# Patient Record
Sex: Male | Born: 1955 | Race: Black or African American | Hispanic: No | Marital: Single
Health system: Southern US, Academic
[De-identification: ages and names within clinical notes are randomized; demographics above are authoritative.]

## PROBLEM LIST (undated history)

## (undated) ENCOUNTER — Ambulatory Visit: Payer: MEDICARE

## (undated) ENCOUNTER — Ambulatory Visit: Payer: MEDICARE | Attending: Otolaryngology | Primary: Otolaryngology

## (undated) ENCOUNTER — Encounter

## (undated) ENCOUNTER — Telehealth: Attending: Otolaryngology | Primary: Otolaryngology

## (undated) ENCOUNTER — Ambulatory Visit

## (undated) ENCOUNTER — Encounter: Attending: Hematology & Oncology | Primary: Hematology & Oncology

## (undated) ENCOUNTER — Telehealth
Attending: Student in an Organized Health Care Education/Training Program | Primary: Student in an Organized Health Care Education/Training Program

## (undated) ENCOUNTER — Encounter: Attending: Adult Health | Primary: Adult Health

## (undated) ENCOUNTER — Encounter: Attending: Geriatric Medicine | Primary: Geriatric Medicine

## (undated) ENCOUNTER — Encounter
Payer: MEDICARE | Attending: Student in an Organized Health Care Education/Training Program | Primary: Student in an Organized Health Care Education/Training Program

## (undated) ENCOUNTER — Telehealth: Attending: Nephrology | Primary: Nephrology

## (undated) ENCOUNTER — Encounter: Attending: Family | Primary: Family

## (undated) ENCOUNTER — Encounter: Payer: MEDICARE | Attending: Speech-Language Pathologist | Primary: Speech-Language Pathologist

## (undated) ENCOUNTER — Encounter
Attending: Student in an Organized Health Care Education/Training Program | Primary: Student in an Organized Health Care Education/Training Program

## (undated) ENCOUNTER — Telehealth

## (undated) ENCOUNTER — Encounter: Attending: Speech-Language Pathologist | Primary: Speech-Language Pathologist

## (undated) ENCOUNTER — Telehealth: Attending: Hematology & Oncology | Primary: Hematology & Oncology

## (undated) ENCOUNTER — Encounter: Payer: MEDICARE | Attending: Otolaryngology | Primary: Otolaryngology

## (undated) ENCOUNTER — Encounter: Attending: Radiation Oncology | Primary: Radiation Oncology

## (undated) ENCOUNTER — Non-Acute Institutional Stay: Payer: MEDICARE

## (undated) ENCOUNTER — Encounter: Attending: Oncology | Primary: Oncology

## (undated) ENCOUNTER — Non-Acute Institutional Stay: Payer: MEDICARE | Attending: Clinical | Primary: Clinical

## (undated) ENCOUNTER — Telehealth: Attending: Clinical | Primary: Clinical

## (undated) ENCOUNTER — Ambulatory Visit: Payer: MEDICARE | Attending: Nephrology | Primary: Nephrology

## (undated) ENCOUNTER — Institutional Professional Consult (permissible substitution)
Payer: MEDICARE | Attending: Student in an Organized Health Care Education/Training Program | Primary: Student in an Organized Health Care Education/Training Program

## (undated) ENCOUNTER — Encounter: Attending: Nephrology | Primary: Nephrology

## (undated) ENCOUNTER — Encounter: Payer: MEDICARE | Attending: Clinical | Primary: Clinical

## (undated) ENCOUNTER — Encounter: Attending: Internal Medicine | Primary: Internal Medicine

## (undated) ENCOUNTER — Ambulatory Visit: Payer: MEDICARE | Attending: Hematology & Oncology | Primary: Hematology & Oncology

## (undated) ENCOUNTER — Encounter: Payer: MEDICARE | Attending: Nephrology | Primary: Nephrology

## (undated) ENCOUNTER — Non-Acute Institutional Stay: Payer: MEDICARE | Attending: Otolaryngology | Primary: Otolaryngology

## (undated) ENCOUNTER — Inpatient Hospital Stay

## (undated) ENCOUNTER — Non-Acute Institutional Stay: Payer: MEDICARE | Attending: Radiation Oncology | Primary: Radiation Oncology

## (undated) ENCOUNTER — Encounter: Payer: MEDICARE | Attending: Internal Medicine | Primary: Internal Medicine

## (undated) ENCOUNTER — Ambulatory Visit: Payer: MEDICARE | Attending: Foot & Ankle Surgery | Primary: Foot & Ankle Surgery

## (undated) ENCOUNTER — Encounter: Attending: Otolaryngology | Primary: Otolaryngology

## (undated) ENCOUNTER — Ambulatory Visit: Payer: MEDICARE | Attending: Clinical | Primary: Clinical

## (undated) ENCOUNTER — Ambulatory Visit
Payer: MEDICARE | Attending: Student in an Organized Health Care Education/Training Program | Primary: Student in an Organized Health Care Education/Training Program

## (undated) ENCOUNTER — Encounter: Payer: MEDICARE | Attending: Radiation Oncology | Primary: Radiation Oncology

## (undated) ENCOUNTER — Telehealth: Attending: Adult Health | Primary: Adult Health

## (undated) ENCOUNTER — Encounter
Payer: MEDICARE | Attending: Pharmacist Clinician (PhC)/ Clinical Pharmacy Specialist | Primary: Pharmacist Clinician (PhC)/ Clinical Pharmacy Specialist

## (undated) ENCOUNTER — Encounter: Payer: MEDICARE | Attending: Pharmacist | Primary: Pharmacist

## (undated) ENCOUNTER — Ambulatory Visit: Payer: MEDICARE | Attending: Registered" | Primary: Registered"

## (undated) ENCOUNTER — Encounter: Payer: MEDICARE | Attending: Trauma Surgery | Primary: Trauma Surgery

## (undated) ENCOUNTER — Non-Acute Institutional Stay: Payer: MEDICARE | Attending: Internal Medicine | Primary: Internal Medicine

## (undated) DIAGNOSIS — C801 Malignant (primary) neoplasm, unspecified: Secondary | ICD-10-CM

## (undated) HISTORY — PX: REPLACEMENT TOTAL KNEE: SUR1224

---
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
## 2005-05-12 ENCOUNTER — Emergency Department: Payer: Self-pay | Admitting: Emergency Medicine

## 2008-09-04 ENCOUNTER — Inpatient Hospital Stay: Payer: Self-pay | Admitting: Internal Medicine

## 2009-08-27 IMAGING — US ABDOMEN ULTRASOUND
1 series · 17 of 25 positions shown · non-contrast
Comparison: No comparison

REASON FOR EXAM: nausea, vomiting, abdominal pain - eval for biliary
disease
COMMENTS:

PROCEDURE:     US  - US ABDOMEN GENERAL SURVEY  - September 03, 2008 [DATE]
RESULT:     Indication: Nausea, vomiting
TECHNIQUE: Multiple gray-scale and color-flow Doppler images of the abdomen
are presented for review.

[Series 1: abdomen ultrasound · 17 of 58 slices shown]
[im 1/58]
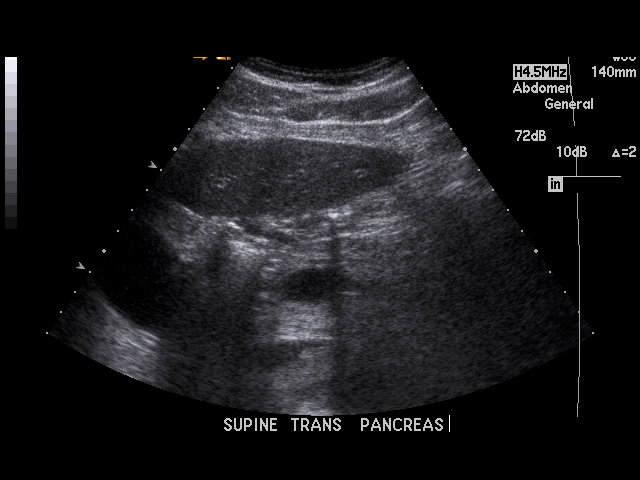
[im 5/58]
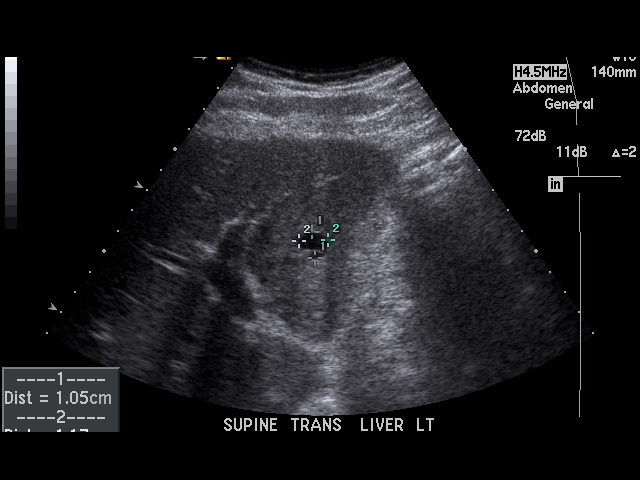
[im 8/58]
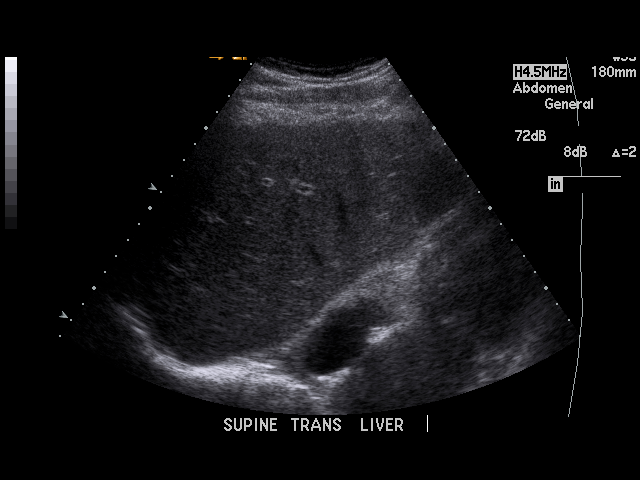
[im 12/58]
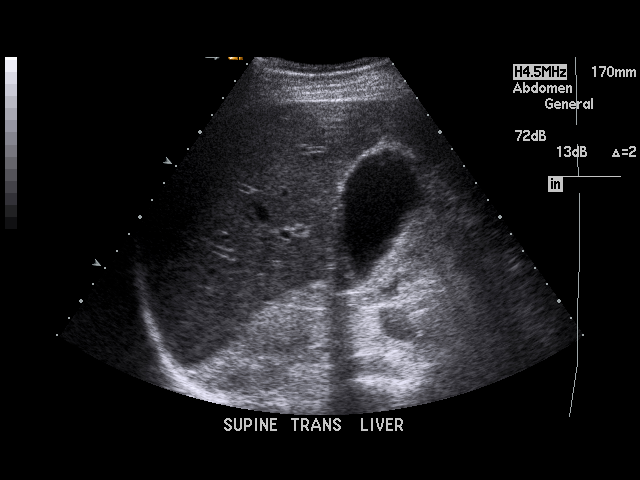
[im 15/58]
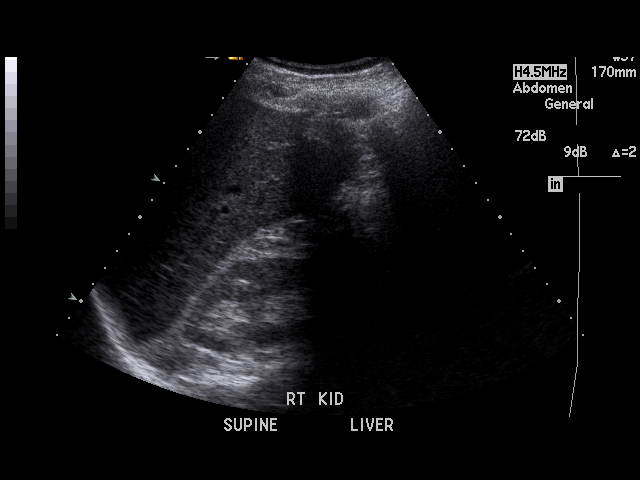
[im 20/58]
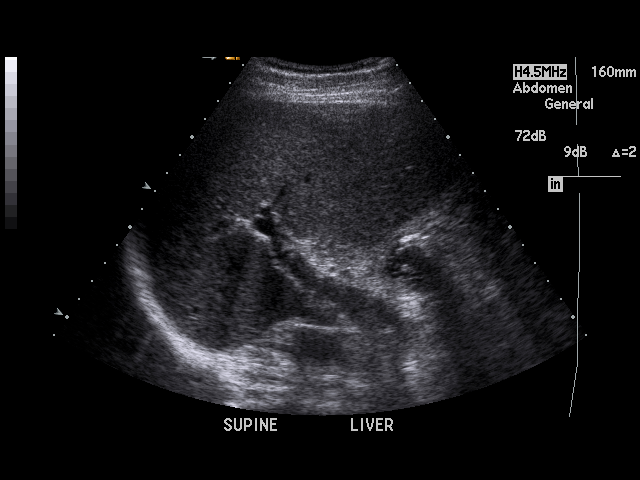
[im 22/58]
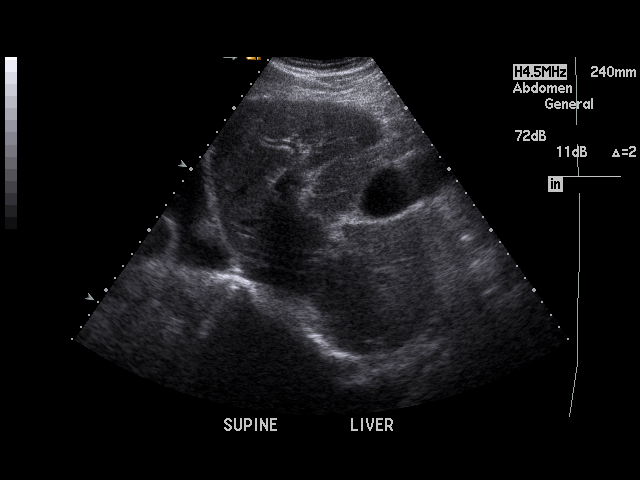
[im 27/58]
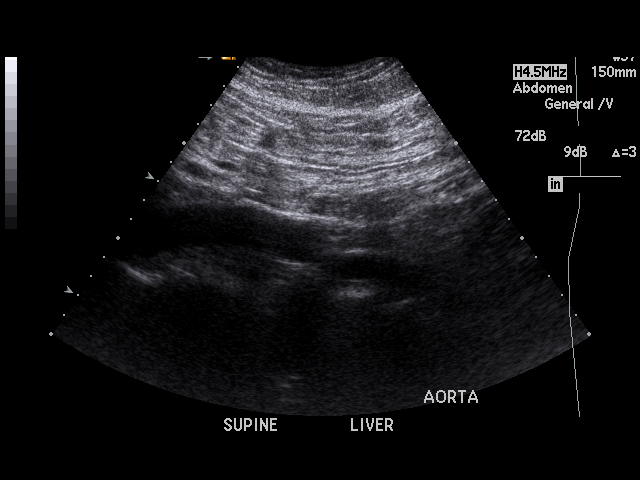
[im 29/58]
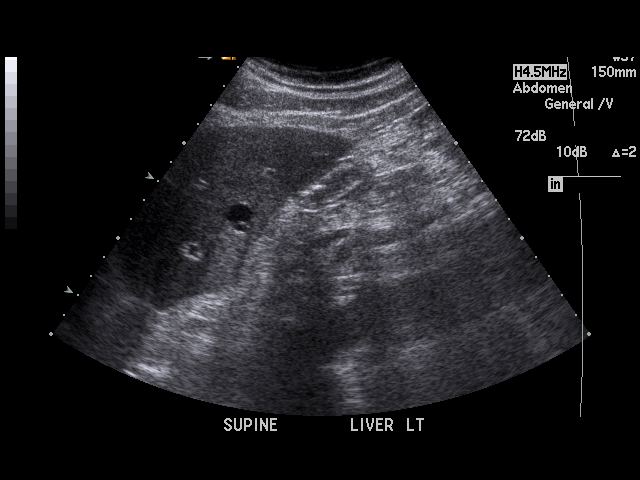
[im 31/58]
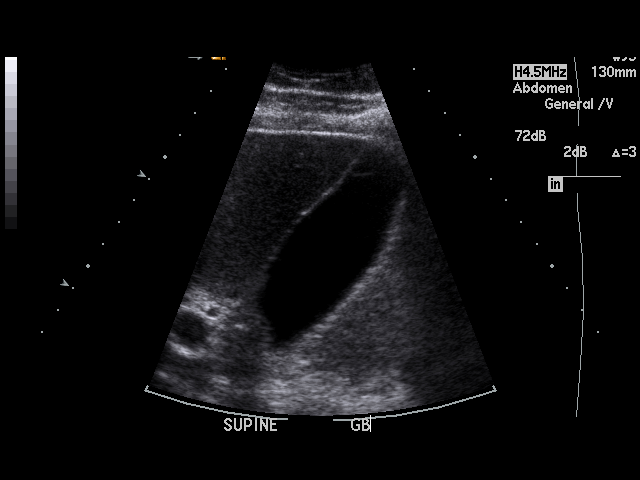
[im 36/58]
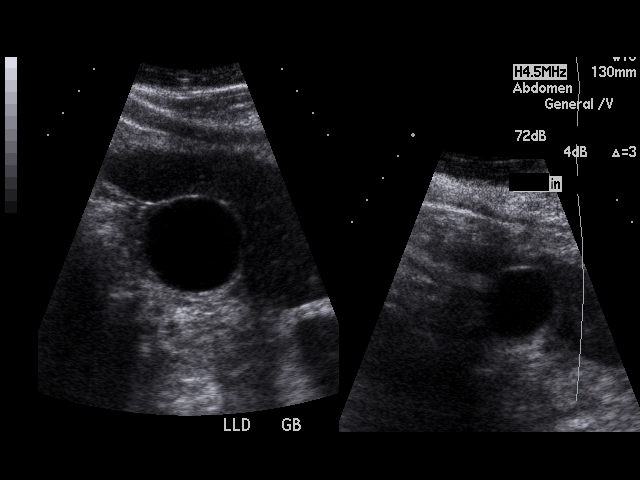
[im 39/58]
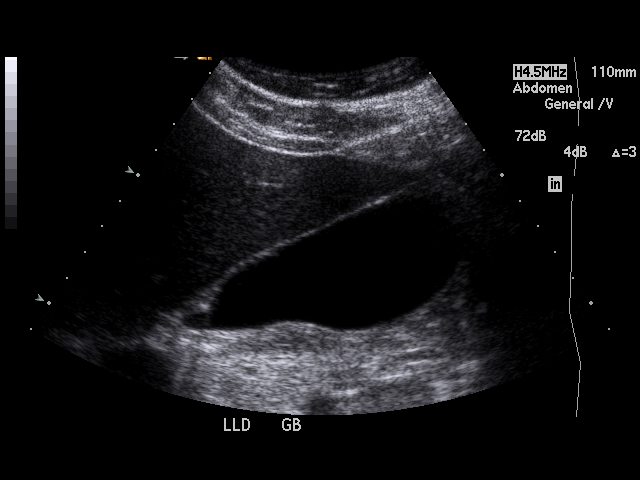
[im 43/58]
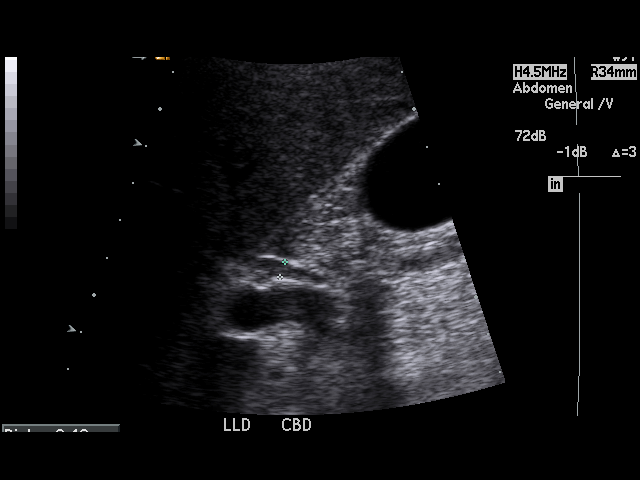
[im 46/58]
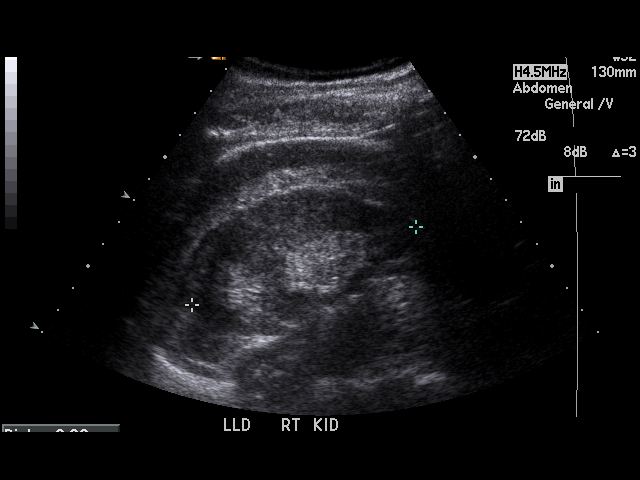
[im 50/58]
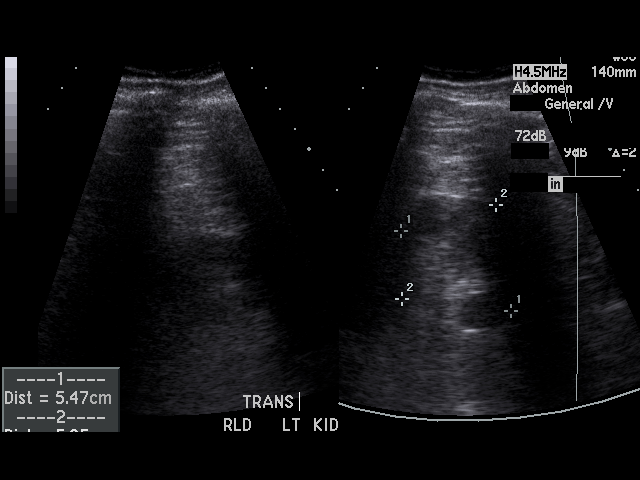
[im 53/58]
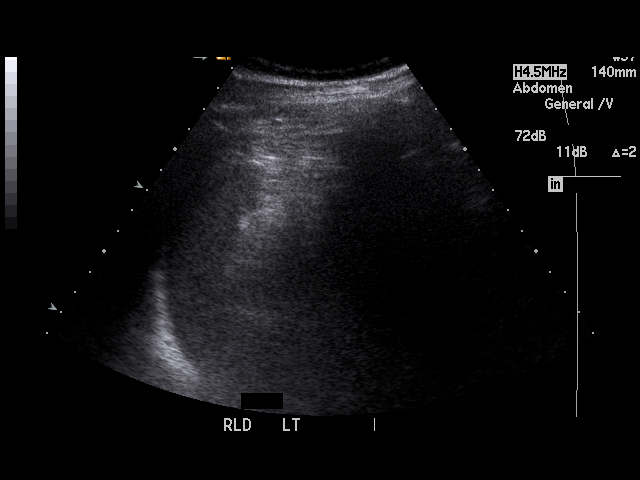
[im 58/58]
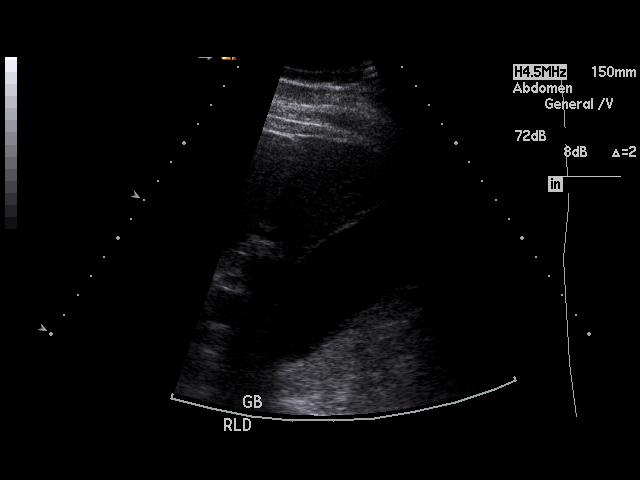

[17 of 25 positions shown; findings below may reference images not displayed]

FINDINGS: Visualized portions of the liver demonstrate normal echogenicity and normal
contours. There is a 12 mm anechoic mass in the left hepatic lobe with
increased through-transmission and an imperceptible posterior wall most
consistent with a cyst. There is no intra or extrahepatic biliary ductal
dilatation. The common duct measures 0.4 cm in maximal diameter. No
cholelithiasis or biliary sludge. No gallbladder wall thickening,
pericholecystic fluid, or sonographic Murphy's sign.

The pancreas is not visualized secondary to overlying bowel gas. The spleen
is unremarkable. Bilateral kidneys are normal in echogenicity and size. The
right kidney measures 9.3 cm. The left kidney measures 10 cm. There are no
renal calculi or hydronephrosis. The abdominal aorta and IVC are
unremarkable.
IMPRESSION: 1. No cholelithiasis or sonographic evidence of acute cholecystitis.
2. Anechoic left hepatic mass likely reflecting a cyst.
3. Nonvisualized pancreas.

## 2011-09-15 ENCOUNTER — Emergency Department: Payer: Self-pay | Admitting: Emergency Medicine

## 2011-09-15 LAB — URINALYSIS, COMPLETE
Bacteria: NONE SEEN
Ketone: NEGATIVE
Leukocyte Esterase: NEGATIVE
Nitrite: NEGATIVE
Ph: 5 (ref 4.5–8.0)
Protein: 100
RBC,UR: <1 /HPF (ref 0–5)
Squamous Epithelial: <1

## 2017-03-05 ENCOUNTER — Ambulatory Visit: Admission: RE | Admit: 2017-03-05 | Discharge: 2017-03-05 | Payer: MEDICARE | Admitting: Psychiatry

## 2017-03-05 DIAGNOSIS — F321 Major depressive disorder, single episode, moderate: Principal | ICD-10-CM

## 2017-03-05 MED ORDER — FLUOXETINE 20 MG CAPSULE
ORAL_CAPSULE | Freq: Every day | ORAL | 0 refills | 0 days | Status: CP
Start: 2017-03-05 — End: 2017-04-04

## 2017-03-05 MED ORDER — MIRTAZAPINE 15 MG TABLET
ORAL_TABLET | Freq: Every evening | ORAL | 0 refills | 0 days | Status: CP
Start: 2017-03-05 — End: 2017-04-04

## 2017-03-09 ENCOUNTER — Ambulatory Visit
Admission: RE | Admit: 2017-03-09 | Discharge: 2017-03-09 | Disposition: A | Discharge status: Home / Self Care | Payer: MEDICARE

## 2017-03-09 ENCOUNTER — Ambulatory Visit
Admission: RE | Admit: 2017-03-09 | Discharge: 2017-03-09 | Disposition: A | Discharge status: Home / Self Care | Payer: MEDICARE | Attending: Hematology & Oncology | Admitting: Hematology & Oncology

## 2017-03-09 ENCOUNTER — Ambulatory Visit
Admission: RE | Admit: 2017-03-09 | Discharge: 2017-03-09 | Disposition: A | Discharge status: Home / Self Care | Attending: Family

## 2017-03-09 DIAGNOSIS — G893 Neoplasm related pain (acute) (chronic): Secondary | ICD-10-CM

## 2017-03-09 DIAGNOSIS — C109 Malignant neoplasm of oropharynx, unspecified: Principal | ICD-10-CM

## 2017-03-09 DIAGNOSIS — J189 Pneumonia, unspecified organism: Secondary | ICD-10-CM

## 2017-03-09 DIAGNOSIS — F1120 Opioid dependence, uncomplicated: Secondary | ICD-10-CM

## 2017-03-09 DIAGNOSIS — Z515 Encounter for palliative care: Secondary | ICD-10-CM

## 2017-03-09 DIAGNOSIS — C760 Malignant neoplasm of head, face and neck: Principal | ICD-10-CM

## 2017-03-09 DIAGNOSIS — Z8589 Personal history of malignant neoplasm of other organs and systems: Principal | ICD-10-CM

## 2017-03-09 MED ORDER — OXYCODONE ER 20 MG TABLET,CRUSH RESISTANT,EXTENDED RELEASE 12 HR
ORAL_TABLET | Freq: Three times a day (TID) | ORAL | 0 refills | 0.00000 days | Status: CP
Start: 2017-03-09 — End: 2017-03-23

## 2017-03-09 MED ORDER — OXYCODONE 15 MG TABLET
ORAL_TABLET | Freq: Four times a day (QID) | ORAL | 0 refills | 0.00000 days | Status: CP | PRN
Start: 2017-03-09 — End: 2017-03-31

## 2017-03-09 MED ORDER — OMEPRAZOLE 20 MG CAPSULE,DELAYED RELEASE
ORAL_CAPSULE | 1 refills | 0 days | Status: CP
Start: 2017-03-09 — End: 2018-03-08

## 2017-03-09 MED ORDER — OXYCODONE ER 20 MG TABLET,CRUSH RESISTANT,EXTENDED RELEASE 12 HR: 20 mg | tablet | Freq: Three times a day (TID) | 0 refills | 0 days | Status: AC

## 2017-03-09 MED FILL — OXYCONTIN/20MG/TAB: OXYCONTIN/20MG/TAB | 14 days supply | Qty: 42 | Fill #0

## 2017-03-09 MED FILL — OXYCODONE HCL/15MG/TABS: OXYCODONE HCL/15MG/TABS | 14 days supply | Qty: 112 | Fill #0

## 2017-03-23 MED ORDER — OXYCODONE 15 MG TABLET
ORAL_TABLET | Freq: Four times a day (QID) | ORAL | 0 refills | 0.00000 days | Status: CP | PRN
Start: 2017-03-23 — End: 2017-03-31

## 2017-03-23 MED FILL — OXYCONTIN/20MG/TAB: OXYCONTIN/20MG/TAB | 14 days supply | Qty: 42 | Fill #0

## 2017-03-23 MED FILL — OXYCODONE HCL/15MG/TABS: OXYCODONE HCL/15MG/TABS | 14 days supply | Qty: 112 | Fill #0

## 2017-03-31 ENCOUNTER — Ambulatory Visit
Admission: RE | Admit: 2017-03-31 | Discharge: 2017-03-31 | Disposition: A | Discharge status: Home / Self Care | Attending: Family | Admitting: Family

## 2017-03-31 ENCOUNTER — Ambulatory Visit
Admission: RE | Admit: 2017-03-31 | Discharge: 2017-03-31 | Disposition: A | Discharge status: Home / Self Care | Payer: MEDICARE | Attending: Student in an Organized Health Care Education/Training Program | Admitting: Student in an Organized Health Care Education/Training Program

## 2017-03-31 ENCOUNTER — Ambulatory Visit
Admission: RE | Admit: 2017-03-31 | Discharge: 2017-03-31 | Disposition: A | Discharge status: Home / Self Care | Payer: MEDICARE | Attending: Otolaryngology | Admitting: Otolaryngology

## 2017-03-31 DIAGNOSIS — Z515 Encounter for palliative care: Secondary | ICD-10-CM

## 2017-03-31 DIAGNOSIS — M1712 Unilateral primary osteoarthritis, left knee: Secondary | ICD-10-CM

## 2017-03-31 DIAGNOSIS — F1123 Opioid dependence with withdrawal: Secondary | ICD-10-CM

## 2017-03-31 DIAGNOSIS — M1711 Unilateral primary osteoarthritis, right knee: Secondary | ICD-10-CM

## 2017-03-31 DIAGNOSIS — R1310 Dysphagia, unspecified: Secondary | ICD-10-CM

## 2017-03-31 DIAGNOSIS — F1120 Opioid dependence, uncomplicated: Secondary | ICD-10-CM

## 2017-03-31 DIAGNOSIS — F321 Major depressive disorder, single episode, moderate: Secondary | ICD-10-CM

## 2017-03-31 DIAGNOSIS — C109 Malignant neoplasm of oropharynx, unspecified: Principal | ICD-10-CM

## 2017-03-31 DIAGNOSIS — G893 Neoplasm related pain (acute) (chronic): Secondary | ICD-10-CM

## 2017-03-31 DIAGNOSIS — Z720 Tobacco use: Secondary | ICD-10-CM

## 2017-03-31 DIAGNOSIS — Z923 Personal history of irradiation: Secondary | ICD-10-CM

## 2017-03-31 DIAGNOSIS — Z9221 Personal history of antineoplastic chemotherapy: Secondary | ICD-10-CM

## 2017-03-31 DIAGNOSIS — F17200 Nicotine dependence, unspecified, uncomplicated: Secondary | ICD-10-CM

## 2017-03-31 MED ORDER — OXYCODONE 15 MG TABLET
ORAL_TABLET | Freq: Four times a day (QID) | ORAL | 0 refills | 0 days | Status: CP | PRN
Start: 2017-03-31 — End: 2017-04-20

## 2017-03-31 MED ORDER — GUAIFENESIN 100 MG/5 ML ORAL LIQUID
ORAL | 5 refills | 0.00000 days | Status: CP | PRN
Start: 2017-03-31 — End: 2017-04-30

## 2017-03-31 MED ORDER — NYSTATIN-TRIAMCINOLONE 100,000 UNIT/G-0.1 % TOPICAL CREAM
Freq: Every evening | TOPICAL | 0 refills | 0 days | Status: CP
Start: 2017-03-31 — End: 2017-04-13

## 2017-03-31 MED FILL — GUAIFENESIN/100/5ML/SYP: GUAIFENESIN/100/5ML/SYP | 8 days supply | Qty: 7 | Fill #0

## 2017-04-06 MED ORDER — OXYCODONE 15 MG TABLET
ORAL_TABLET | Freq: Four times a day (QID) | ORAL | 0 refills | 0 days | Status: CP | PRN
Start: 2017-04-06 — End: 2017-04-20

## 2017-04-06 MED ORDER — MIRTAZAPINE 15 MG TABLET
ORAL_TABLET | Freq: Every evening | ORAL | 0 refills | 0 days | Status: CP
Start: 2017-04-06 — End: 2017-05-11

## 2017-04-06 MED ORDER — FLUOXETINE 20 MG CAPSULE
ORAL_CAPSULE | Freq: Every day | ORAL | 0 refills | 0.00000 days | Status: CP
Start: 2017-04-06 — End: 2017-05-11

## 2017-04-06 MED ORDER — OXYCODONE ER 20 MG TABLET,CRUSH RESISTANT,EXTENDED RELEASE 12 HR
ORAL_TABLET | Freq: Three times a day (TID) | ORAL | 0 refills | 0 days | Status: CP
Start: 2017-04-06 — End: 2017-04-20

## 2017-04-06 MED FILL — OXYCONTIN/20MG/TAB: OXYCONTIN/20MG/TAB | 14 days supply | Qty: 42 | Fill #0

## 2017-04-06 MED FILL — OXYCODONE HCL/15MG/TABS: OXYCODONE HCL/15MG/TABS | 7 days supply | Qty: 56 | Fill #0

## 2017-04-08 ENCOUNTER — Ambulatory Visit
Admission: RE | Admit: 2017-04-08 | Discharge: 2017-04-08 | Disposition: A | Discharge status: Home / Self Care | Payer: MEDICARE | Attending: Adult Reconstructive Orthopaedic Surgery | Admitting: Adult Reconstructive Orthopaedic Surgery

## 2017-04-08 ENCOUNTER — Ambulatory Visit
Admission: RE | Admit: 2017-04-08 | Discharge: 2017-04-08 | Disposition: A | Discharge status: Home / Self Care | Payer: MEDICARE

## 2017-04-08 DIAGNOSIS — G8929 Other chronic pain: Secondary | ICD-10-CM

## 2017-04-08 DIAGNOSIS — M25562 Pain in left knee: Secondary | ICD-10-CM

## 2017-04-08 DIAGNOSIS — M1712 Unilateral primary osteoarthritis, left knee: Principal | ICD-10-CM

## 2017-04-13 MED ORDER — OXYCODONE 15 MG TABLET
ORAL_TABLET | Freq: Four times a day (QID) | ORAL | 0 refills | 0.00000 days | Status: CP | PRN
Start: 2017-04-13 — End: 2017-04-20

## 2017-04-13 MED ORDER — NYSTATIN-TRIAMCINOLONE 100,000 UNIT/G-0.1 % TOPICAL CREAM: g | Freq: Every evening | 0 refills | 0 days | Status: AC

## 2017-04-13 MED ORDER — NYSTATIN-TRIAMCINOLONE 100,000 UNIT/G-0.1 % TOPICAL CREAM
Freq: Every evening | TOPICAL | 0 refills | 0.00000 days | Status: CP
Start: 2017-04-13 — End: 2017-04-13

## 2017-04-13 MED FILL — OXYCODONE HCL/15MG/TABS: OXYCODONE HCL/15MG/TABS | 7 days supply | Qty: 56 | Fill #0

## 2017-04-13 MED FILL — LEVOTHYROXINE SODIUM/125MCG/TABS: LEVOTHYROXINE SODIUM/125MCG/TABS | 30 days supply | Qty: 30 | Fill #4

## 2017-04-20 ENCOUNTER — Ambulatory Visit: Admission: RE | Admit: 2017-04-20 | Discharge: 2017-04-20 | Disposition: A | Discharge status: Home / Self Care

## 2017-04-20 DIAGNOSIS — F1120 Opioid dependence, uncomplicated: Secondary | ICD-10-CM

## 2017-04-20 DIAGNOSIS — R1310 Dysphagia, unspecified: Secondary | ICD-10-CM

## 2017-04-20 DIAGNOSIS — G893 Neoplasm related pain (acute) (chronic): Secondary | ICD-10-CM

## 2017-04-20 DIAGNOSIS — C109 Malignant neoplasm of oropharynx, unspecified: Principal | ICD-10-CM

## 2017-04-20 DIAGNOSIS — M1711 Unilateral primary osteoarthritis, right knee: Secondary | ICD-10-CM

## 2017-04-20 DIAGNOSIS — M1712 Unilateral primary osteoarthritis, left knee: Secondary | ICD-10-CM

## 2017-04-20 DIAGNOSIS — F321 Major depressive disorder, single episode, moderate: Secondary | ICD-10-CM

## 2017-04-20 DIAGNOSIS — Z515 Encounter for palliative care: Secondary | ICD-10-CM

## 2017-04-20 DIAGNOSIS — F1123 Opioid dependence with withdrawal: Secondary | ICD-10-CM

## 2017-04-20 MED ORDER — OXYCODONE ER 20 MG TABLET,CRUSH RESISTANT,EXTENDED RELEASE 12 HR
ORAL_TABLET | Freq: Three times a day (TID) | ORAL | 0 refills | 0.00000 days | Status: CP
Start: 2017-04-20 — End: 2017-05-11

## 2017-04-20 MED ORDER — OXYCODONE 15 MG TABLET
ORAL_TABLET | Freq: Four times a day (QID) | ORAL | 0 refills | 0 days | Status: CP | PRN
Start: 2017-04-20 — End: 2017-05-11

## 2017-04-20 MED FILL — OXYCODONE HCL/15MG/TABS: OXYCODONE HCL/15MG/TABS | 7 days supply | Qty: 56 | Fill #0

## 2017-04-20 MED FILL — OXYCONTIN/20MG/TAB: OXYCONTIN/20MG/TAB | 14 days supply | Qty: 42 | Fill #0

## 2017-04-27 MED ORDER — OXYCODONE 15 MG TABLET
ORAL_TABLET | Freq: Four times a day (QID) | ORAL | 0 refills | 0 days | Status: CP | PRN
Start: 2017-04-27 — End: 2017-05-11

## 2017-04-27 MED FILL — OXYCODONE HCL/15MG/TABS: OXYCODONE HCL/15MG/TABS | 7 days supply | Qty: 56 | Fill #0

## 2017-05-04 MED ORDER — OXYCODONE ER 20 MG TABLET,CRUSH RESISTANT,EXTENDED RELEASE 12 HR
ORAL_TABLET | Freq: Three times a day (TID) | ORAL | 0 refills | 0 days | Status: CP
Start: 2017-05-04 — End: 2017-05-18

## 2017-05-04 MED ORDER — OXYCODONE 15 MG TABLET
ORAL_TABLET | Freq: Four times a day (QID) | ORAL | 0 refills | 0.00000 days | Status: CP | PRN
Start: 2017-05-04 — End: 2017-05-11

## 2017-05-04 MED FILL — OXYCODONE HCL/15MG/TABS: OXYCODONE HCL/15MG/TABS | 7 days supply | Qty: 56 | Fill #0

## 2017-05-04 MED FILL — OXYCONTIN/20MG/TAB: OXYCONTIN/20MG/TAB | 14 days supply | Qty: 42 | Fill #0

## 2017-05-06 ENCOUNTER — Ambulatory Visit
Admission: RE | Admit: 2017-05-06 | Discharge: 2017-05-06 | Disposition: A | Discharge status: Home / Self Care | Payer: MEDICARE

## 2017-05-06 ENCOUNTER — Ambulatory Visit
Admission: RE | Admit: 2017-05-06 | Discharge: 2017-05-06 | Disposition: A | Discharge status: Home / Self Care | Payer: MEDICARE | Attending: Addiction (Substance Use Disorder) | Admitting: Addiction (Substance Use Disorder)

## 2017-05-06 DIAGNOSIS — F1994 Other psychoactive substance use, unspecified with psychoactive substance-induced mood disorder: Secondary | ICD-10-CM

## 2017-05-06 DIAGNOSIS — L299 Pruritus, unspecified: Secondary | ICD-10-CM

## 2017-05-06 DIAGNOSIS — F3289 Other specified depressive episodes: Secondary | ICD-10-CM

## 2017-05-06 DIAGNOSIS — F1021 Alcohol dependence, in remission: Principal | ICD-10-CM

## 2017-05-06 DIAGNOSIS — Z01818 Encounter for other preprocedural examination: Principal | ICD-10-CM

## 2017-05-06 MED ORDER — NYSTATIN-TRIAMCINOLONE 100,000 UNIT/G-0.1 % TOPICAL CREAM
Freq: Every evening | TOPICAL | 0 refills | 0 days | Status: CP
Start: 2017-05-06 — End: 2017-05-11

## 2017-05-11 ENCOUNTER — Ambulatory Visit
Admission: RE | Admit: 2017-05-11 | Discharge: 2017-05-11 | Disposition: A | Discharge status: Home / Self Care | Payer: MEDICARE

## 2017-05-11 ENCOUNTER — Ambulatory Visit
Admission: RE | Admit: 2017-05-11 | Discharge: 2017-05-11 | Disposition: A | Discharge status: Home / Self Care | Payer: MEDICARE | Attending: Adult Reconstructive Orthopaedic Surgery | Admitting: Adult Reconstructive Orthopaedic Surgery

## 2017-05-11 DIAGNOSIS — M259 Joint disorder, unspecified: Principal | ICD-10-CM

## 2017-05-11 DIAGNOSIS — Z01818 Encounter for other preprocedural examination: Principal | ICD-10-CM

## 2017-05-11 DIAGNOSIS — N183 Chronic kidney disease, stage 3 (moderate): Secondary | ICD-10-CM

## 2017-05-11 DIAGNOSIS — E039 Hypothyroidism, unspecified: Secondary | ICD-10-CM

## 2017-05-11 DIAGNOSIS — M1712 Unilateral primary osteoarthritis, left knee: Principal | ICD-10-CM

## 2017-05-11 DIAGNOSIS — I10 Essential (primary) hypertension: Secondary | ICD-10-CM

## 2017-05-11 MED ORDER — OXYCODONE 15 MG TABLET
ORAL_TABLET | Freq: Four times a day (QID) | ORAL | 0 refills | 0.00000 days | Status: CP | PRN
Start: 2017-05-11 — End: 2017-05-18

## 2017-05-11 MED FILL — OXYCODONE HCL/15MG/TABS: OXYCODONE HCL/15MG/TABS | 7 days supply | Qty: 56 | Fill #0

## 2017-05-13 MED ORDER — NYSTATIN-TRIAMCINOLONE 100,000 UNIT/G-0.1 % TOPICAL CREAM
Freq: Every evening | TOPICAL | 0 refills | 0.00000 days | Status: CP
Start: 2017-05-13 — End: 2018-03-08

## 2017-05-18 ENCOUNTER — Ambulatory Visit
Admission: RE | Admit: 2017-05-18 | Discharge: 2017-05-18 | Disposition: A | Discharge status: Home / Self Care | Payer: MEDICARE | Attending: Family | Admitting: Family

## 2017-05-18 DIAGNOSIS — C109 Malignant neoplasm of oropharynx, unspecified: Secondary | ICD-10-CM

## 2017-05-18 DIAGNOSIS — R1310 Dysphagia, unspecified: Secondary | ICD-10-CM

## 2017-05-18 DIAGNOSIS — M25562 Pain in left knee: Secondary | ICD-10-CM

## 2017-05-18 DIAGNOSIS — F321 Major depressive disorder, single episode, moderate: Secondary | ICD-10-CM

## 2017-05-18 DIAGNOSIS — F329 Major depressive disorder, single episode, unspecified: Secondary | ICD-10-CM

## 2017-05-18 DIAGNOSIS — M25561 Pain in right knee: Secondary | ICD-10-CM

## 2017-05-18 DIAGNOSIS — Z515 Encounter for palliative care: Secondary | ICD-10-CM

## 2017-05-18 DIAGNOSIS — G8929 Other chronic pain: Secondary | ICD-10-CM

## 2017-05-18 DIAGNOSIS — G893 Neoplasm related pain (acute) (chronic): Secondary | ICD-10-CM

## 2017-05-18 DIAGNOSIS — F1120 Opioid dependence, uncomplicated: Secondary | ICD-10-CM

## 2017-05-18 DIAGNOSIS — F419 Anxiety disorder, unspecified: Principal | ICD-10-CM

## 2017-05-18 MED ORDER — OXYCODONE ER 20 MG TABLET,CRUSH RESISTANT,EXTENDED RELEASE 12 HR
ORAL_TABLET | Freq: Three times a day (TID) | ORAL | 0 refills | 0.00000 days | Status: CP
Start: 2017-05-18 — End: 2017-05-29

## 2017-05-18 MED ORDER — DULOXETINE 20 MG CAPSULE,DELAYED RELEASE: each | 2 refills | 0 days

## 2017-05-18 MED ORDER — OXYCODONE 15 MG TABLET
ORAL_TABLET | Freq: Four times a day (QID) | ORAL | 0 refills | 0 days | Status: CP | PRN
Start: 2017-05-18 — End: 2017-05-29

## 2017-05-18 MED ORDER — DULOXETINE 20 MG CAPSULE,DELAYED RELEASE
ORAL_CAPSULE | Freq: Every evening | ORAL | 2 refills | 0.00000 days | Status: CP
Start: 2017-05-18 — End: 2017-06-10

## 2017-05-18 MED FILL — OXYCONTIN/20MG/TAB: OXYCONTIN/20MG/TAB | 14 days supply | Qty: 42 | Fill #0

## 2017-05-18 MED FILL — OXYCODONE HCL/15MG/TABS: OXYCODONE HCL/15MG/TABS | 14 days supply | Qty: 112 | Fill #0

## 2017-05-18 MED FILL — DULOXETINE/20MG/CPEP: DULOXETINE/20MG/CPEP | 33 days supply | Qty: 60 | Fill #0

## 2017-05-25 DIAGNOSIS — M1712 Unilateral primary osteoarthritis, left knee: Principal | ICD-10-CM

## 2017-05-26 ENCOUNTER — Inpatient Hospital Stay
Admission: RE | Admit: 2017-05-26 | Discharge: 2017-05-29 | Disposition: A | Discharge status: Skilled Nursing Facility | Payer: MEDICARE

## 2017-05-26 ENCOUNTER — Inpatient Hospital Stay
Admission: RE | Admit: 2017-05-26 | Discharge: 2017-05-29 | Disposition: A | Discharge status: Skilled Nursing Facility | Payer: MEDICARE | Attending: Student in an Organized Health Care Education/Training Program | Admitting: Student in an Organized Health Care Education/Training Program

## 2017-05-26 ENCOUNTER — Inpatient Hospital Stay
Admission: RE | Admit: 2017-05-26 | Discharge: 2017-05-29 | Disposition: A | Discharge status: Skilled Nursing Facility | Payer: MEDICARE | Attending: Adult Reconstructive Orthopaedic Surgery | Admitting: Adult Reconstructive Orthopaedic Surgery

## 2017-05-26 DIAGNOSIS — M1712 Unilateral primary osteoarthritis, left knee: Principal | ICD-10-CM

## 2017-05-26 MED ORDER — ASPIRIN 81 MG TABLET,DELAYED RELEASE
ORAL_TABLET | Freq: Two times a day (BID) | ORAL | 0 refills | 0 days | Status: CP
Start: 2017-05-26 — End: 2017-08-12

## 2017-05-26 MED ORDER — OXYCODONE 5 MG TABLET
ORAL_TABLET | ORAL | 0 refills | 0 days | Status: CP | PRN
Start: 2017-05-26 — End: 2017-05-29

## 2017-05-26 MED ORDER — ACETAMINOPHEN 500 MG TABLET
ORAL_TABLET | Freq: Three times a day (TID) | ORAL | 0 refills | 0.00000 days | Status: CP
Start: 2017-05-26 — End: 2017-06-10

## 2017-05-26 MED ORDER — POLYETHYLENE GLYCOL 3350 17 GRAM ORAL POWDER PACKET
PACK | Freq: Every day | ORAL | 0 refills | 0.00000 days | Status: CP
Start: 2017-05-26 — End: 2017-06-10

## 2017-05-26 MED ORDER — ONDANSETRON 4 MG DISINTEGRATING TABLET
ORAL_TABLET | Freq: Three times a day (TID) | ORAL | 0 refills | 0 days | Status: CP | PRN
Start: 2017-05-26 — End: 2017-06-02

## 2017-05-26 MED ORDER — DOCUSATE SODIUM 100 MG CAPSULE
ORAL_CAPSULE | Freq: Two times a day (BID) | ORAL | 0 refills | 0.00000 days | Status: CP
Start: 2017-05-26 — End: 2017-05-29

## 2017-05-29 MED ORDER — CARVEDILOL 12.5 MG TABLET
ORAL_TABLET | Freq: Two times a day (BID) | ORAL | 0 refills | 0 days | Status: CP
Start: 2017-05-29 — End: 2017-08-12

## 2017-05-29 MED ORDER — SENNOSIDES 8.6 MG TABLET
ORAL_TABLET | Freq: Two times a day (BID) | ORAL | 0 refills | 0.00000 days | Status: CP
Start: 2017-05-29 — End: 2017-06-10

## 2017-05-29 MED ORDER — OXYCODONE 5 MG TABLET
ORAL_TABLET | ORAL | 0 refills | 0 days | Status: CP | PRN
Start: 2017-05-29 — End: 2017-06-03

## 2017-05-30 MED ORDER — FERROUS SULFATE 325 MG (65 MG IRON) TABLET
ORAL_TABLET | Freq: Every day | ORAL | 0 refills | 0.00000 days | Status: CP
Start: 2017-05-30 — End: 2018-02-18

## 2017-06-01 MED ORDER — OXYCODONE 15 MG TABLET
ORAL_TABLET | Freq: Four times a day (QID) | ORAL | 0 refills | 0 days | Status: CP | PRN
Start: 2017-06-01 — End: 2017-05-29

## 2017-06-01 MED ORDER — OXYCODONE ER 20 MG TABLET,CRUSH RESISTANT,EXTENDED RELEASE 12 HR
ORAL_TABLET | Freq: Two times a day (BID) | ORAL | 0 refills | 0.00000 days | Status: CP | PRN
Start: 2017-06-01 — End: 2017-05-29

## 2017-06-10 ENCOUNTER — Ambulatory Visit
Admission: RE | Admit: 2017-06-10 | Discharge: 2017-06-10 | Disposition: A | Discharge status: Home / Self Care | Payer: MEDICARE | Attending: Adult Reconstructive Orthopaedic Surgery | Admitting: Adult Reconstructive Orthopaedic Surgery

## 2017-06-10 ENCOUNTER — Ambulatory Visit
Admission: RE | Admit: 2017-06-10 | Discharge: 2017-06-10 | Disposition: A | Discharge status: Home / Self Care | Payer: MEDICARE | Attending: Family | Admitting: Family

## 2017-06-10 DIAGNOSIS — M25562 Pain in left knee: Secondary | ICD-10-CM

## 2017-06-10 DIAGNOSIS — M1712 Unilateral primary osteoarthritis, left knee: Principal | ICD-10-CM

## 2017-06-10 DIAGNOSIS — K5903 Drug induced constipation: Secondary | ICD-10-CM

## 2017-06-10 DIAGNOSIS — T402X5A Adverse effect of other opioids, initial encounter: Secondary | ICD-10-CM

## 2017-06-10 DIAGNOSIS — G8929 Other chronic pain: Secondary | ICD-10-CM

## 2017-06-10 DIAGNOSIS — Z515 Encounter for palliative care: Secondary | ICD-10-CM

## 2017-06-10 DIAGNOSIS — G893 Neoplasm related pain (acute) (chronic): Secondary | ICD-10-CM

## 2017-06-10 DIAGNOSIS — C109 Malignant neoplasm of oropharynx, unspecified: Principal | ICD-10-CM

## 2017-06-10 DIAGNOSIS — F1120 Opioid dependence, uncomplicated: Secondary | ICD-10-CM

## 2017-06-10 DIAGNOSIS — M25561 Pain in right knee: Secondary | ICD-10-CM

## 2017-06-10 MED ORDER — ACETAMINOPHEN 500 MG TABLET
ORAL_TABLET | Freq: Three times a day (TID) | ORAL | 3 refills | 0.00000 days | Status: CP
Start: 2017-06-10 — End: 2017-06-10

## 2017-06-10 MED ORDER — DULOXETINE 20 MG CAPSULE,DELAYED RELEASE: each | 2 refills | 0 days

## 2017-06-10 MED ORDER — ACETAMINOPHEN 500 MG TABLET: 1000 mg | tablet | Freq: Three times a day (TID) | 3 refills | 0 days | Status: AC

## 2017-06-10 MED ORDER — POLYETHYLENE GLYCOL 3350 17 GRAM/DOSE ORAL POWDER
Freq: Every day | ORAL | 2 refills | 0 days | Status: CP | PRN
Start: 2017-06-10 — End: 2018-03-08

## 2017-06-10 MED ORDER — SENNOSIDES 8.6 MG TABLET: 2 | tablet | 2 refills | 0 days

## 2017-06-10 MED ORDER — VOLTAREN 1 % TOPICAL GEL
5 refills | 0 days
Start: 2017-06-10 — End: 2017-06-10

## 2017-06-10 MED ORDER — DICLOFENAC 1 % TOPICAL GEL
Freq: Four times a day (QID) | TOPICAL | 5 refills | 0.00000 days | Status: CP | PRN
Start: 2017-06-10 — End: 2017-07-15

## 2017-06-10 MED ORDER — DULOXETINE 20 MG CAPSULE,DELAYED RELEASE
Freq: Every evening | ORAL | 2 refills | 0.00000 days | Status: CP
Start: 2017-06-10 — End: 2017-06-10

## 2017-06-10 MED ORDER — OXYCODONE 15 MG TABLET
ORAL_TABLET | Freq: Four times a day (QID) | ORAL | 0 refills | 0.00000 days | Status: CP | PRN
Start: 2017-06-10 — End: 2017-06-22

## 2017-06-10 MED ORDER — SENNOSIDES 8.6 MG TABLET
ORAL_TABLET | Freq: Two times a day (BID) | ORAL | 2 refills | 0.00000 days | Status: CP
Start: 2017-06-10 — End: 2017-06-10

## 2017-06-10 MED FILL — OXYCODONE HCL/15MG/TABS: OXYCODONE HCL/15MG/TABS | 14 days supply | Qty: 112 | Fill #0

## 2017-06-10 MED FILL — ACETAMINOPHEN/500MG/TABS: ACETAMINOPHEN/500MG/TABS | 30 days supply | Qty: 180 | Fill #0

## 2017-06-10 MED FILL — SENNA/8.6MG/TABS: SENNA/8.6MG/TABS | 30 days supply | Qty: 120 | Fill #0

## 2017-06-10 MED FILL — VOLTAREN/1%/GEL: VOLTAREN/1%/GEL | 25 days supply | Qty: 4 | Fill #0

## 2017-06-10 MED FILL — POLYETHYLENE GLYCOL/3350/POWD: POLYETHYLENE GLYCOL/3350/POWD | 15 days supply | Qty: 1 | Fill #0

## 2017-06-22 ENCOUNTER — Ambulatory Visit
Admission: RE | Admit: 2017-06-22 | Discharge: 2017-06-22 | Disposition: A | Discharge status: Home / Self Care | Attending: Family

## 2017-06-22 DIAGNOSIS — F418 Other specified anxiety disorders: Secondary | ICD-10-CM

## 2017-06-22 DIAGNOSIS — C109 Malignant neoplasm of oropharynx, unspecified: Secondary | ICD-10-CM

## 2017-06-22 DIAGNOSIS — G8929 Other chronic pain: Secondary | ICD-10-CM

## 2017-06-22 DIAGNOSIS — F1120 Opioid dependence, uncomplicated: Secondary | ICD-10-CM

## 2017-06-22 DIAGNOSIS — M25562 Pain in left knee: Secondary | ICD-10-CM

## 2017-06-22 DIAGNOSIS — Z515 Encounter for palliative care: Principal | ICD-10-CM

## 2017-06-22 MED ORDER — GABAPENTIN 300 MG CAPSULE: capsule | 1 refills | 0 days | Status: AC

## 2017-06-22 MED ORDER — GABAPENTIN 300 MG CAPSULE
ORAL_CAPSULE | ORAL | 1 refills | 0.00000 days | Status: CP
Start: 2017-06-22 — End: 2017-06-22

## 2017-06-22 MED FILL — GABAPENTIN/300MG/CAPS: GABAPENTIN/300MG/CAPS | 30 days supply | Qty: 120 | Fill #0

## 2017-06-22 MED FILL — DULOXETINE/20MG/CPEP: DULOXETINE/20MG/CPEP | 30 days supply | Qty: 60 | Fill #0

## 2017-06-24 MED ORDER — OXYCODONE 15 MG TABLET
ORAL_TABLET | Freq: Four times a day (QID) | ORAL | 0 refills | 0.00000 days | Status: CP | PRN
Start: 2017-06-24 — End: 2017-07-03

## 2017-07-08 ENCOUNTER — Ambulatory Visit
Admission: RE | Admit: 2017-07-08 | Discharge: 2017-07-08 | Disposition: A | Discharge status: Home / Self Care | Payer: MEDICARE

## 2017-07-08 ENCOUNTER — Ambulatory Visit
Admission: RE | Admit: 2017-07-08 | Discharge: 2017-07-08 | Disposition: A | Discharge status: Home / Self Care | Payer: MEDICARE | Attending: Adult Reconstructive Orthopaedic Surgery | Admitting: Adult Reconstructive Orthopaedic Surgery

## 2017-07-08 DIAGNOSIS — Z96652 Presence of left artificial knee joint: Principal | ICD-10-CM

## 2017-07-08 MED ORDER — OXYCODONE 15 MG TABLET
ORAL_TABLET | Freq: Four times a day (QID) | ORAL | 0 refills | 0 days | Status: CP | PRN
Start: 2017-07-08 — End: 2017-07-15

## 2017-07-08 MED FILL — OXYCODONE HCL/15MG/TABS: OXYCODONE HCL/15MG/TABS | 7 days supply | Qty: 56 | Fill #0

## 2017-07-08 MED FILL — LEVOTHYROXINE SODIUM/125MCG/TABS: LEVOTHYROXINE SODIUM/125MCG/TABS | 30 days supply | Qty: 30 | Fill #5

## 2017-07-15 ENCOUNTER — Ambulatory Visit
Admission: RE | Admit: 2017-07-15 | Discharge: 2017-07-15 | Disposition: A | Discharge status: Home / Self Care | Payer: MEDICARE | Attending: Family | Admitting: Family

## 2017-07-15 DIAGNOSIS — M25561 Pain in right knee: Secondary | ICD-10-CM

## 2017-07-15 DIAGNOSIS — F1120 Opioid dependence, uncomplicated: Secondary | ICD-10-CM

## 2017-07-15 DIAGNOSIS — N6310 Unspecified lump in the right breast, unspecified quadrant: Principal | ICD-10-CM

## 2017-07-15 DIAGNOSIS — Z515 Encounter for palliative care: Secondary | ICD-10-CM

## 2017-07-15 DIAGNOSIS — G8929 Other chronic pain: Secondary | ICD-10-CM

## 2017-07-15 DIAGNOSIS — M25562 Pain in left knee: Secondary | ICD-10-CM

## 2017-07-15 DIAGNOSIS — F321 Major depressive disorder, single episode, moderate: Secondary | ICD-10-CM

## 2017-07-15 DIAGNOSIS — C109 Malignant neoplasm of oropharynx, unspecified: Secondary | ICD-10-CM

## 2017-07-15 MED ORDER — OXYCODONE 15 MG TABLET
ORAL_TABLET | Freq: Four times a day (QID) | ORAL | 0 refills | 0 days | Status: CP | PRN
Start: 2017-07-15 — End: 2017-07-15

## 2017-07-15 MED ORDER — VOLTAREN 1 % TOPICAL GEL
5 refills | 0 days
Start: 2017-07-15 — End: 2017-07-15

## 2017-07-15 MED ORDER — DICLOFENAC 1 % TOPICAL GEL
Freq: Four times a day (QID) | TOPICAL | 5 refills | 0.00000 days | Status: CP | PRN
Start: 2017-07-15 — End: 2017-09-22

## 2017-07-15 MED FILL — VOLTAREN/1%/GEL: VOLTAREN/1%/GEL | 25 days supply | Qty: 4 | Fill #0

## 2017-07-15 MED FILL — OXYCODONE HCL/15MG/TABS: OXYCODONE HCL/15MG/TABS | 14 days supply | Qty: 112 | Fill #0

## 2017-07-29 MED ORDER — OXYCODONE 15 MG TABLET
ORAL_TABLET | Freq: Four times a day (QID) | ORAL | 0 refills | 0 days | Status: CP | PRN
Start: 2017-07-29 — End: 2017-08-12

## 2017-08-03 ENCOUNTER — Ambulatory Visit
Admission: RE | Admit: 2017-08-03 | Discharge: 2017-08-31 | Disposition: A | Discharge status: Home / Self Care | Payer: MEDICARE | Attending: Adult Health | Admitting: Adult Health

## 2017-08-03 ENCOUNTER — Ambulatory Visit
Admission: RE | Admit: 2017-08-03 | Discharge: 2017-08-03 | Disposition: A | Discharge status: Home / Self Care | Payer: MEDICARE

## 2017-08-03 ENCOUNTER — Ambulatory Visit
Admission: RE | Admit: 2017-08-03 | Discharge: 2017-08-31 | Disposition: A | Discharge status: Home / Self Care | Payer: MEDICARE | Attending: Radiation Oncology | Admitting: Radiation Oncology

## 2017-08-03 DIAGNOSIS — C109 Malignant neoplasm of oropharynx, unspecified: Principal | ICD-10-CM

## 2017-08-03 DIAGNOSIS — Z8589 Personal history of malignant neoplasm of other organs and systems: Secondary | ICD-10-CM

## 2017-08-03 MED ORDER — LEVOTHYROXINE 125 MCG TABLET
ORAL_TABLET | 11 refills | 0.00000 days | Status: CP
Start: 2017-08-03 — End: 2017-08-12

## 2017-08-03 MED ORDER — LEVOTHYROXINE 125 MCG TABLET: tablet | 11 refills | 0 days

## 2017-08-03 MED FILL — LEVOTHYROXINE SODIUM/125MCG/TABS: LEVOTHYROXINE SODIUM/125MCG/TABS | 30 days supply | Qty: 30 | Fill #0

## 2017-08-04 ENCOUNTER — Ambulatory Visit
Admission: RE | Admit: 2017-08-04 | Discharge: 2017-08-04 | Disposition: A | Discharge status: Home / Self Care | Payer: MEDICARE

## 2017-08-04 DIAGNOSIS — N6310 Unspecified lump in the right breast, unspecified quadrant: Principal | ICD-10-CM

## 2017-08-12 ENCOUNTER — Ambulatory Visit
Admission: RE | Admit: 2017-08-12 | Discharge: 2017-08-12 | Disposition: A | Discharge status: Home / Self Care | Payer: MEDICARE | Attending: Family | Admitting: Family

## 2017-08-12 ENCOUNTER — Ambulatory Visit
Admission: RE | Admit: 2017-08-12 | Discharge: 2017-08-12 | Disposition: A | Discharge status: Home / Self Care | Attending: Family | Admitting: Family

## 2017-08-12 ENCOUNTER — Ambulatory Visit
Admission: RE | Admit: 2017-08-12 | Discharge: 2017-08-12 | Disposition: A | Discharge status: Home / Self Care | Payer: MEDICARE

## 2017-08-12 DIAGNOSIS — F1120 Opioid dependence, uncomplicated: Secondary | ICD-10-CM

## 2017-08-12 DIAGNOSIS — F1123 Opioid dependence with withdrawal: Secondary | ICD-10-CM

## 2017-08-12 DIAGNOSIS — E039 Hypothyroidism, unspecified: Principal | ICD-10-CM

## 2017-08-12 DIAGNOSIS — G894 Chronic pain syndrome: Secondary | ICD-10-CM

## 2017-08-12 DIAGNOSIS — Z515 Encounter for palliative care: Principal | ICD-10-CM

## 2017-08-12 MED ORDER — LEVOTHYROXINE 125 MCG TABLET
ORAL_TABLET | Freq: Every day | ORAL | 11 refills | 0.00000 days | Status: CP
Start: 2017-08-12 — End: 2017-08-12

## 2017-08-12 MED ORDER — OXYCODONE 15 MG TABLET
ORAL_TABLET | ORAL | 0 refills | 0 days | Status: CP | PRN
Start: 2017-08-12 — End: 2017-09-18

## 2017-08-12 MED ORDER — LEVOTHYROXINE 125 MCG TABLET: tablet | 11 refills | 0 days

## 2017-08-12 MED ORDER — DULOXETINE 20 MG CAPSULE,DELAYED RELEASE
ORAL_CAPSULE | Freq: Every evening | ORAL | 2 refills | 0.00000 days | Status: CP
Start: 2017-08-12 — End: 2017-12-02

## 2017-08-12 MED FILL — OXYCODONE HCL/15MG/TABS: OXYCODONE HCL/15MG/TABS | 14 days supply | Qty: 84 | Fill #0

## 2017-08-26 MED ORDER — OXYCODONE 15 MG TABLET
ORAL_TABLET | ORAL | 0 refills | 0 days | Status: CP | PRN
Start: 2017-08-26 — End: 2017-09-18

## 2017-09-09 MED ORDER — OXYCODONE 15 MG TABLET
ORAL_TABLET | ORAL | 0 refills | 0 days | Status: CP | PRN
Start: 2017-09-09 — End: 2017-09-18

## 2017-09-22 ENCOUNTER — Encounter
Admit: 2017-09-22 | Discharge: 2017-09-23 | Payer: MEDICARE | Attending: Geriatric Medicine | Primary: Geriatric Medicine

## 2017-09-22 DIAGNOSIS — R52 Pain, unspecified: Principal | ICD-10-CM

## 2017-09-22 DIAGNOSIS — F1199 Opioid use, unspecified with unspecified opioid-induced disorder: Secondary | ICD-10-CM

## 2017-09-22 DIAGNOSIS — C109 Malignant neoplasm of oropharynx, unspecified: Secondary | ICD-10-CM

## 2017-09-22 DIAGNOSIS — Z515 Encounter for palliative care: Secondary | ICD-10-CM

## 2017-09-22 MED ORDER — DICLOFENAC 1 % TOPICAL GEL
Freq: Four times a day (QID) | TOPICAL | 5 refills | 0 days | Status: CP | PRN
Start: 2017-09-22 — End: 2018-08-18

## 2017-10-12 ENCOUNTER — Encounter: Admit: 2017-10-12 | Discharge: 2017-10-13 | Payer: MEDICARE

## 2017-10-12 ENCOUNTER — Encounter
Admit: 2017-10-12 | Discharge: 2017-10-13 | Payer: MEDICARE | Attending: Student in an Organized Health Care Education/Training Program | Primary: Student in an Organized Health Care Education/Training Program

## 2017-10-12 DIAGNOSIS — E039 Hypothyroidism, unspecified: Principal | ICD-10-CM

## 2017-10-12 DIAGNOSIS — K4090 Unilateral inguinal hernia, without obstruction or gangrene, not specified as recurrent: Principal | ICD-10-CM

## 2017-10-12 MED ORDER — OMEPRAZOLE MAGNESIUM 20 MG TABLET,DELAYED RELEASE
ORAL_TABLET | Freq: Every day | ORAL | 1 refills | 0.00000 days | Status: CP
Start: 2017-10-12 — End: 2018-03-08

## 2017-10-12 MED ORDER — OMEPRAZOLE 20 MG CAPSULE,DELAYED RELEASE
ORAL_CAPSULE | ORAL | 1 refills | 0 days
Start: 2017-10-12 — End: 2018-10-12

## 2017-10-12 MED FILL — OMEPRAZOLE 20MG/20MG/CPDR: OMEPRAZOLE 20MG/20MG/CPDR | 28 days supply | Qty: 28 | Fill #0

## 2017-10-20 ENCOUNTER — Encounter: Admit: 2017-10-20 | Discharge: 2017-10-29 | Payer: MEDICARE | Attending: Adult Health | Primary: Adult Health

## 2017-10-20 ENCOUNTER — Ambulatory Visit
Admit: 2017-10-20 | Discharge: 2017-10-29 | Payer: MEDICARE | Attending: Radiation Oncology | Primary: Radiation Oncology

## 2017-10-20 DIAGNOSIS — C109 Malignant neoplasm of oropharynx, unspecified: Principal | ICD-10-CM

## 2017-10-20 MED FILL — LEVOTHYROXINE SODIUM/125MCG/TABS: LEVOTHYROXINE SODIUM/125MCG/TABS | 30 days supply | Qty: 30 | Fill #1

## 2017-10-20 MED FILL — VOLTAREN/1%/GEL: VOLTAREN/1%/GEL | 25 days supply | Qty: 4 | Fill #1

## 2017-10-30 MED ORDER — CHANTIX 1 MG TABLET
ORAL_TABLET | Freq: Two times a day (BID) | ORAL | 1 refills | 0 days | Status: CP
Start: 2017-10-30 — End: 2018-03-08

## 2017-11-09 ENCOUNTER — Encounter: Admit: 2017-11-09 | Discharge: 2017-11-09 | Disposition: A | Discharge status: Home / Self Care | Payer: MEDICARE

## 2017-11-10 ENCOUNTER — Encounter: Admit: 2017-11-10 | Discharge: 2017-11-11 | Payer: MEDICARE | Attending: Otolaryngology | Primary: Otolaryngology

## 2017-11-10 DIAGNOSIS — C109 Malignant neoplasm of oropharynx, unspecified: Principal | ICD-10-CM

## 2017-11-10 DIAGNOSIS — Z923 Personal history of irradiation: Secondary | ICD-10-CM

## 2017-11-10 DIAGNOSIS — R1312 Dysphagia, oropharyngeal phase: Secondary | ICD-10-CM

## 2017-11-10 DIAGNOSIS — Z9221 Personal history of antineoplastic chemotherapy: Secondary | ICD-10-CM

## 2017-12-02 ENCOUNTER — Encounter
Admit: 2017-12-02 | Discharge: 2017-12-02 | Payer: MEDICARE | Attending: Student in an Organized Health Care Education/Training Program | Primary: Student in an Organized Health Care Education/Training Program

## 2017-12-02 DIAGNOSIS — N183 Chronic kidney disease, stage 3 (moderate): Principal | ICD-10-CM

## 2017-12-02 MED ORDER — DULOXETINE 60 MG CAPSULE,DELAYED RELEASE
ORAL_CAPSULE | Freq: Every evening | ORAL | 2 refills | 0.00000 days | Status: CP
Start: 2017-12-02 — End: 2019-02-28

## 2017-12-02 MED ORDER — LEVOTHYROXINE 125 MCG TABLET
ORAL_TABLET | Freq: Every day | ORAL | 2 refills | 0.00000 days | Status: CP
Start: 2017-12-02 — End: 2019-02-28

## 2017-12-02 MED ORDER — DULOXETINE 60 MG CAPSULE,DELAYED RELEASE: 60 mg | capsule | 2 refills | 0 days | Status: AC

## 2017-12-02 MED ORDER — LISINOPRIL 10 MG TABLET: 5 mg | tablet | Freq: Every day | 11 refills | 0 days | Status: AC

## 2017-12-02 MED ORDER — LEVOTHYROXINE 125 MCG TABLET: tablet | 2 refills | 0 days | Status: AC

## 2017-12-02 MED ORDER — LISINOPRIL 10 MG TABLET
ORAL_TABLET | Freq: Every day | ORAL | 11 refills | 0.00000 days | Status: CP
Start: 2017-12-02 — End: 2018-01-13

## 2017-12-02 MED FILL — LEVOTHYROXINE SODIUM/125MCG/TABS: LEVOTHYROXINE SODIUM/125MCG/TABS | 90 days supply | Qty: 90 | Fill #0

## 2017-12-02 MED FILL — LISINOPRIL/10MG/TABS: LISINOPRIL/10MG/TABS | 60 days supply | Qty: 30 | Fill #0

## 2017-12-02 MED FILL — DULOXETINE/60MG/CPEP: DULOXETINE/60MG/CPEP | 60 days supply | Qty: 60 | Fill #0

## 2018-01-13 ENCOUNTER — Encounter: Admit: 2018-01-13 | Discharge: 2018-01-13 | Payer: MEDICARE

## 2018-01-13 ENCOUNTER — Encounter
Admit: 2018-01-13 | Discharge: 2018-01-13 | Payer: MEDICARE | Attending: Student in an Organized Health Care Education/Training Program | Primary: Student in an Organized Health Care Education/Training Program

## 2018-01-13 DIAGNOSIS — E039 Hypothyroidism, unspecified: Secondary | ICD-10-CM

## 2018-01-13 DIAGNOSIS — I10 Essential (primary) hypertension: Secondary | ICD-10-CM

## 2018-01-13 DIAGNOSIS — M67912 Unspecified disorder of synovium and tendon, left shoulder: Principal | ICD-10-CM

## 2018-01-13 DIAGNOSIS — M1712 Unilateral primary osteoarthritis, left knee: Principal | ICD-10-CM

## 2018-01-13 MED ORDER — LISINOPRIL 2.5 MG TABLET: 3 mg | tablet | Freq: Every day | 11 refills | 0 days | Status: AC

## 2018-01-13 MED ORDER — LISINOPRIL 2.5 MG TABLET: tablet | 11 refills | 0 days

## 2018-01-13 MED ORDER — LISINOPRIL 2.5 MG TABLET
ORAL_TABLET | Freq: Every day | ORAL | 11 refills | 0.00000 days | Status: CP
Start: 2018-01-13 — End: 2018-02-17

## 2018-01-13 MED FILL — LISINOPRIL/2.5MG/TABS: LISINOPRIL/2.5MG/TABS | 30 days supply | Qty: 30 | Fill #0

## 2018-01-22 ENCOUNTER — Encounter
Admit: 2018-01-22 | Discharge: 2018-01-22 | Payer: MEDICARE | Attending: Foot & Ankle Surgery | Primary: Foot & Ankle Surgery

## 2018-01-22 DIAGNOSIS — B351 Tinea unguium: Secondary | ICD-10-CM

## 2018-01-22 DIAGNOSIS — M21612 Bunion of left foot: Secondary | ICD-10-CM

## 2018-01-22 DIAGNOSIS — M79674 Pain in right toe(s): Secondary | ICD-10-CM

## 2018-01-22 DIAGNOSIS — M79672 Pain in left foot: Secondary | ICD-10-CM

## 2018-01-22 DIAGNOSIS — M21611 Bunion of right foot: Secondary | ICD-10-CM

## 2018-01-22 DIAGNOSIS — M2041 Other hammer toe(s) (acquired), right foot: Secondary | ICD-10-CM

## 2018-01-22 DIAGNOSIS — M2042 Other hammer toe(s) (acquired), left foot: Secondary | ICD-10-CM

## 2018-01-22 DIAGNOSIS — M79675 Pain in left toe(s): Secondary | ICD-10-CM

## 2018-01-22 DIAGNOSIS — I10 Essential (primary) hypertension: Principal | ICD-10-CM

## 2018-01-22 DIAGNOSIS — M79671 Pain in right foot: Principal | ICD-10-CM

## 2018-02-17 ENCOUNTER — Encounter: Admit: 2018-02-17 | Discharge: 2018-02-17 | Payer: MEDICARE

## 2018-02-17 ENCOUNTER — Ambulatory Visit: Admit: 2018-02-17 | Discharge: 2018-02-17 | Payer: MEDICARE | Attending: Nephrology | Primary: Nephrology

## 2018-02-17 ENCOUNTER — Ambulatory Visit: Admit: 2018-02-17 | Discharge: 2018-02-17 | Payer: MEDICARE | Attending: Registered" | Primary: Registered"

## 2018-02-17 DIAGNOSIS — R351 Nocturia: Secondary | ICD-10-CM

## 2018-02-17 DIAGNOSIS — D509 Iron deficiency anemia, unspecified: Secondary | ICD-10-CM

## 2018-02-17 DIAGNOSIS — E039 Hypothyroidism, unspecified: Secondary | ICD-10-CM

## 2018-02-17 DIAGNOSIS — N183 Chronic kidney disease, stage 3 (moderate): Secondary | ICD-10-CM

## 2018-02-17 DIAGNOSIS — R399 Unspecified symptoms and signs involving the genitourinary system: Secondary | ICD-10-CM

## 2018-02-17 MED ORDER — TAMSULOSIN 0.4 MG CAPSULE: 0 mg | capsule | Freq: Every day | 3 refills | 0 days | Status: AC

## 2018-02-17 MED ORDER — TAMSULOSIN 0.4 MG CAPSULE
ORAL_CAPSULE | Freq: Every day | ORAL | 3 refills | 0.00000 days | Status: CP
Start: 2018-02-17 — End: 2018-12-14

## 2018-02-17 MED FILL — TAMSULOSIN/0.4MG/CAPS: TAMSULOSIN/0.4MG/CAPS | 90 days supply | Qty: 90 | Fill #0

## 2018-02-18 MED ORDER — FERROUS SULFATE 325 MG (65 MG IRON) TABLET
ORAL_TABLET | Freq: Every day | ORAL | 3 refills | 0 days | Status: CP
Start: 2018-02-18 — End: 2018-05-06

## 2018-02-22 MED FILL — LEVOTHYROXINE SODIUM/125MCG/TABS: LEVOTHYROXINE SODIUM/125MCG/TABS | 90 days supply | Qty: 90 | Fill #1

## 2018-03-08 ENCOUNTER — Encounter
Admit: 2018-03-08 | Discharge: 2018-03-09 | Payer: MEDICARE | Attending: Student in an Organized Health Care Education/Training Program | Primary: Student in an Organized Health Care Education/Training Program

## 2018-03-08 ENCOUNTER — Encounter: Admit: 2018-03-08 | Discharge: 2018-03-09 | Payer: MEDICARE

## 2018-03-08 DIAGNOSIS — M12812 Other specific arthropathies, not elsewhere classified, left shoulder: Secondary | ICD-10-CM

## 2018-03-08 DIAGNOSIS — C109 Malignant neoplasm of oropharynx, unspecified: Secondary | ICD-10-CM

## 2018-03-08 DIAGNOSIS — R1312 Dysphagia, oropharyngeal phase: Secondary | ICD-10-CM

## 2018-03-08 DIAGNOSIS — M67912 Unspecified disorder of synovium and tendon, left shoulder: Principal | ICD-10-CM

## 2018-03-08 DIAGNOSIS — F321 Major depressive disorder, single episode, moderate: Secondary | ICD-10-CM

## 2018-03-08 DIAGNOSIS — G894 Chronic pain syndrome: Secondary | ICD-10-CM

## 2018-03-08 DIAGNOSIS — N400 Enlarged prostate without lower urinary tract symptoms: Principal | ICD-10-CM

## 2018-03-08 DIAGNOSIS — N183 Chronic kidney disease, stage 3 (moderate): Secondary | ICD-10-CM

## 2018-03-22 ENCOUNTER — Encounter: Admit: 2018-03-22 | Discharge: 2018-03-22 | Payer: MEDICARE

## 2018-03-22 DIAGNOSIS — C109 Malignant neoplasm of oropharynx, unspecified: Principal | ICD-10-CM

## 2018-04-04 ENCOUNTER — Encounter
Admit: 2018-04-04 | Discharge: 2018-04-05 | Disposition: A | Discharge status: Home / Self Care | Payer: MEDICARE | Attending: Emergency Medicine

## 2018-04-04 DIAGNOSIS — K4090 Unilateral inguinal hernia, without obstruction or gangrene, not specified as recurrent: Principal | ICD-10-CM

## 2018-04-04 MED ORDER — OXYCODONE 5 MG TABLET
ORAL_TABLET | Freq: Four times a day (QID) | ORAL | 0 refills | 0.00000 days | Status: CP | PRN
Start: 2018-04-04 — End: 2018-04-09

## 2018-04-09 ENCOUNTER — Encounter: Admit: 2018-04-09 | Discharge: 2018-04-10 | Payer: MEDICARE

## 2018-04-09 ENCOUNTER — Encounter
Admit: 2018-04-09 | Discharge: 2018-04-10 | Payer: MEDICARE | Attending: Student in an Organized Health Care Education/Training Program | Primary: Student in an Organized Health Care Education/Training Program

## 2018-04-09 DIAGNOSIS — L7682 Other postprocedural complications of skin and subcutaneous tissue: Principal | ICD-10-CM

## 2018-04-09 DIAGNOSIS — K4090 Unilateral inguinal hernia, without obstruction or gangrene, not specified as recurrent: Secondary | ICD-10-CM

## 2018-04-09 MED ORDER — OXYCODONE 5 MG TABLET
ORAL_TABLET | Freq: Four times a day (QID) | ORAL | 0 refills | 0 days | Status: CP | PRN
Start: 2018-04-09 — End: 2018-04-09

## 2018-04-09 MED ORDER — OXYCODONE 5 MG CAPSULE
ORAL_CAPSULE | Freq: Four times a day (QID) | ORAL | 0 refills | 0 days | Status: CP | PRN
Start: 2018-04-09 — End: 2018-04-29

## 2018-04-09 MED ORDER — OXYCODONE 5 MG TABLET: 10 mg | tablet | Freq: Four times a day (QID) | 0 refills | 0 days | Status: AC

## 2018-04-22 ENCOUNTER — Ambulatory Visit: Admit: 2018-04-22 | Discharge: 2018-04-23 | Payer: MEDICARE

## 2018-04-22 ENCOUNTER — Encounter
Admit: 2018-04-22 | Discharge: 2018-04-22 | Payer: MEDICARE | Attending: Student in an Organized Health Care Education/Training Program | Primary: Student in an Organized Health Care Education/Training Program

## 2018-04-22 DIAGNOSIS — K4090 Unilateral inguinal hernia, without obstruction or gangrene, not specified as recurrent: Principal | ICD-10-CM

## 2018-04-29 ENCOUNTER — Ambulatory Visit: Admit: 2018-04-29 | Discharge: 2018-04-30 | Payer: MEDICARE | Attending: Registered" | Primary: Registered"

## 2018-04-29 ENCOUNTER — Ambulatory Visit: Admit: 2018-04-29 | Discharge: 2018-04-30 | Payer: MEDICARE | Attending: Clinical | Primary: Clinical

## 2018-04-29 ENCOUNTER — Ambulatory Visit: Admit: 2018-04-29 | Discharge: 2018-04-30 | Payer: MEDICARE

## 2018-04-29 DIAGNOSIS — Z0000 Encounter for general adult medical examination without abnormal findings: Principal | ICD-10-CM

## 2018-04-29 MED FILL — DULOXETINE 60 MG CAPSULE,DELAYED RELEASE: ORAL | 60 days supply | Qty: 60 | Fill #0

## 2018-04-29 MED FILL — DULOXETINE 60 MG CAPSULE,DELAYED RELEASE: 60 days supply | Qty: 60 | Fill #0 | Status: AC

## 2018-05-06 ENCOUNTER — Encounter
Admit: 2018-05-06 | Discharge: 2018-05-07 | Payer: MEDICARE | Attending: Student in an Organized Health Care Education/Training Program | Primary: Student in an Organized Health Care Education/Training Program

## 2018-05-06 ENCOUNTER — Encounter: Admit: 2018-05-06 | Discharge: 2018-05-07 | Payer: MEDICARE | Attending: Clinical | Primary: Clinical

## 2018-05-06 DIAGNOSIS — F1120 Opioid dependence, uncomplicated: Secondary | ICD-10-CM

## 2018-05-06 DIAGNOSIS — F331 Major depressive disorder, recurrent, moderate: Principal | ICD-10-CM

## 2018-05-06 DIAGNOSIS — C109 Malignant neoplasm of oropharynx, unspecified: Principal | ICD-10-CM

## 2018-05-06 DIAGNOSIS — Z1211 Encounter for screening for malignant neoplasm of colon: Secondary | ICD-10-CM

## 2018-05-06 DIAGNOSIS — K4090 Unilateral inguinal hernia, without obstruction or gangrene, not specified as recurrent: Secondary | ICD-10-CM

## 2018-05-06 DIAGNOSIS — G893 Neoplasm related pain (acute) (chronic): Secondary | ICD-10-CM

## 2018-05-06 DIAGNOSIS — N183 Chronic kidney disease, stage 3 (moderate): Secondary | ICD-10-CM

## 2018-05-06 DIAGNOSIS — F1123 Opioid dependence with withdrawal: Secondary | ICD-10-CM

## 2018-05-06 MED ORDER — MIRTAZAPINE 15 MG TABLET
ORAL_TABLET | Freq: Every evening | ORAL | 5 refills | 0.00000 days | Status: CP
Start: 2018-05-06 — End: 2019-01-19
  Filled 2018-05-06: qty 30, 30d supply, fill #0

## 2018-05-06 MED FILL — MIRTAZAPINE 15 MG TABLET: 30 days supply | Qty: 30 | Fill #0 | Status: AC

## 2018-06-22 MED FILL — OMEPRAZOLE 20 MG CAPSULE,DELAYED RELEASE: 28 days supply | Qty: 28 | Fill #0 | Status: AC

## 2018-06-22 MED FILL — MIRTAZAPINE 15 MG TABLET: ORAL | 30 days supply | Qty: 30 | Fill #1

## 2018-06-22 MED FILL — DULOXETINE 60 MG CAPSULE,DELAYED RELEASE: ORAL | 30 days supply | Qty: 30 | Fill #1

## 2018-06-22 MED FILL — TAMSULOSIN 0.4 MG CAPSULE: ORAL | 30 days supply | Qty: 30 | Fill #0

## 2018-06-22 MED FILL — LEVOTHYROXINE 125 MCG TABLET: ORAL | 30 days supply | Qty: 30 | Fill #0

## 2018-06-22 MED FILL — OMEPRAZOLE 20 MG CAPSULE,DELAYED RELEASE: ORAL | 28 days supply | Qty: 28 | Fill #0

## 2018-06-22 MED FILL — TAMSULOSIN 0.4 MG CAPSULE: 30 days supply | Qty: 30 | Fill #0 | Status: AC

## 2018-06-22 MED FILL — LEVOTHYROXINE 125 MCG TABLET: 30 days supply | Qty: 30 | Fill #0 | Status: AC

## 2018-06-22 MED FILL — DULOXETINE 60 MG CAPSULE,DELAYED RELEASE: 30 days supply | Qty: 30 | Fill #1 | Status: AC

## 2018-06-22 MED FILL — MIRTAZAPINE 15 MG TABLET: 30 days supply | Qty: 30 | Fill #1 | Status: AC

## 2018-08-03 ENCOUNTER — Encounter: Admit: 2018-08-03 | Discharge: 2018-08-04 | Payer: MEDICARE | Attending: Otolaryngology | Primary: Otolaryngology

## 2018-08-03 DIAGNOSIS — Z923 Personal history of irradiation: Secondary | ICD-10-CM

## 2018-08-03 DIAGNOSIS — C109 Malignant neoplasm of oropharynx, unspecified: Principal | ICD-10-CM

## 2018-08-03 DIAGNOSIS — Z9221 Personal history of antineoplastic chemotherapy: Secondary | ICD-10-CM

## 2018-08-11 ENCOUNTER — Encounter: Admit: 2018-08-11 | Discharge: 2018-08-11 | Payer: MEDICARE | Attending: Registered Nurse | Primary: Registered Nurse

## 2018-08-11 ENCOUNTER — Ambulatory Visit: Admit: 2018-08-11 | Discharge: 2018-08-11 | Payer: MEDICARE

## 2018-08-11 DIAGNOSIS — Z1211 Encounter for screening for malignant neoplasm of colon: Principal | ICD-10-CM

## 2018-08-18 ENCOUNTER — Encounter
Admit: 2018-08-18 | Discharge: 2018-08-18 | Payer: MEDICARE | Attending: Student in an Organized Health Care Education/Training Program | Primary: Student in an Organized Health Care Education/Training Program

## 2018-08-18 ENCOUNTER — Encounter: Admit: 2018-08-18 | Discharge: 2018-08-18 | Payer: MEDICARE

## 2018-08-18 DIAGNOSIS — Z13220 Encounter for screening for lipoid disorders: Secondary | ICD-10-CM

## 2018-08-18 DIAGNOSIS — N183 Chronic kidney disease, stage 3 (moderate): Principal | ICD-10-CM

## 2018-08-18 DIAGNOSIS — N189 Chronic kidney disease, unspecified: Secondary | ICD-10-CM

## 2018-08-18 DIAGNOSIS — M67912 Unspecified disorder of synovium and tendon, left shoulder: Secondary | ICD-10-CM

## 2018-08-18 MED ORDER — DICLOFENAC 1 % TOPICAL GEL
Freq: Four times a day (QID) | TOPICAL | 5 refills | 0 days | Status: CP | PRN
Start: 2018-08-18 — End: 2019-08-18

## 2018-08-20 MED ORDER — VARENICLINE 0.5 MG TABLET
ORAL_TABLET | 0 refills | 0 days | Status: CP
Start: 2018-08-20 — End: 2018-12-14

## 2018-09-09 ENCOUNTER — Encounter: Admit: 2018-09-09 | Discharge: 2018-09-10 | Payer: MEDICARE

## 2018-09-09 DIAGNOSIS — G8929 Other chronic pain: Secondary | ICD-10-CM

## 2018-09-09 DIAGNOSIS — M25512 Pain in left shoulder: Secondary | ICD-10-CM

## 2018-09-09 DIAGNOSIS — M67912 Unspecified disorder of synovium and tendon, left shoulder: Principal | ICD-10-CM

## 2018-10-19 ENCOUNTER — Ambulatory Visit: Admit: 2018-10-19 | Discharge: 2018-10-30 | Payer: MEDICARE

## 2018-10-19 ENCOUNTER — Encounter: Admit: 2018-10-19 | Discharge: 2018-10-30 | Payer: MEDICARE

## 2018-10-19 ENCOUNTER — Encounter
Admit: 2018-10-19 | Discharge: 2018-10-30 | Payer: MEDICARE | Attending: Radiation Oncology | Primary: Radiation Oncology

## 2018-10-19 DIAGNOSIS — C109 Malignant neoplasm of oropharynx, unspecified: Secondary | ICD-10-CM

## 2018-10-19 DIAGNOSIS — Z85819 Personal history of malignant neoplasm of unspecified site of lip, oral cavity, and pharynx: Secondary | ICD-10-CM

## 2018-10-19 DIAGNOSIS — M25512 Pain in left shoulder: Principal | ICD-10-CM

## 2018-10-19 DIAGNOSIS — R1310 Dysphagia, unspecified: Principal | ICD-10-CM

## 2018-12-14 ENCOUNTER — Encounter: Admit: 2018-12-14 | Discharge: 2018-12-15 | Payer: MEDICARE

## 2018-12-14 DIAGNOSIS — B9689 Other specified bacterial agents as the cause of diseases classified elsewhere: Secondary | ICD-10-CM

## 2018-12-14 DIAGNOSIS — J329 Chronic sinusitis, unspecified: Principal | ICD-10-CM

## 2018-12-14 MED ORDER — BENZONATATE 100 MG CAPSULE
ORAL_CAPSULE | Freq: Four times a day (QID) | ORAL | 1 refills | 0 days | Status: CP | PRN
Start: 2018-12-14 — End: 2019-12-14

## 2018-12-14 MED ORDER — POLYETHYLENE GLYCOL 3350 17 GRAM/DOSE ORAL POWDER
Freq: Every day | ORAL | 0 refills | 0 days | Status: CP
Start: 2018-12-14 — End: ?

## 2018-12-14 MED ORDER — CETIRIZINE 10 MG TABLET
ORAL_TABLET | Freq: Every day | ORAL | 2 refills | 0 days | Status: CP
Start: 2018-12-14 — End: 2019-12-14

## 2018-12-14 MED ORDER — AMOXICILLIN 875 MG-POTASSIUM CLAVULANATE 125 MG TABLET
ORAL_TABLET | Freq: Two times a day (BID) | ORAL | 0 refills | 0 days | Status: CP
Start: 2018-12-14 — End: 2018-12-21

## 2019-01-19 ENCOUNTER — Encounter
Admit: 2019-01-19 | Discharge: 2019-01-20 | Payer: MEDICARE | Attending: Student in an Organized Health Care Education/Training Program | Primary: Student in an Organized Health Care Education/Training Program

## 2019-01-19 DIAGNOSIS — F329 Major depressive disorder, single episode, unspecified: Principal | ICD-10-CM

## 2019-01-19 DIAGNOSIS — E039 Hypothyroidism, unspecified: Secondary | ICD-10-CM

## 2019-01-19 DIAGNOSIS — J479 Bronchiectasis, uncomplicated: Secondary | ICD-10-CM

## 2019-01-19 MED ORDER — ALBUTEROL SULFATE 2.5 MG/3 ML (0.083 %) SOLUTION FOR NEBULIZATION
Freq: Three times a day (TID) | RESPIRATORY_TRACT | 2 refills | 0 days | Status: CP | PRN
Start: 2019-01-19 — End: 2020-01-19

## 2019-01-19 MED ORDER — SODIUM CHLORIDE 3 % FOR NEBULIZATION
Freq: Three times a day (TID) | RESPIRATORY_TRACT | 12 refills | 0.00000 days | Status: CP | PRN
Start: 2019-01-19 — End: 2020-01-19

## 2019-01-19 MED ORDER — MIRTAZAPINE 15 MG TABLET
ORAL_TABLET | Freq: Every evening | ORAL | 5 refills | 0.00000 days | Status: CP
Start: 2019-01-19 — End: 2019-07-18

## 2019-02-02 ENCOUNTER — Encounter: Admit: 2019-02-02 | Discharge: 2019-02-03 | Payer: MEDICARE

## 2019-02-02 ENCOUNTER — Encounter
Admit: 2019-02-02 | Discharge: 2019-02-03 | Payer: MEDICARE | Attending: Student in an Organized Health Care Education/Training Program | Primary: Student in an Organized Health Care Education/Training Program

## 2019-02-02 DIAGNOSIS — Z95828 Presence of other vascular implants and grafts: Principal | ICD-10-CM

## 2019-02-11 ENCOUNTER — Encounter: Admit: 2019-02-11 | Discharge: 2019-02-12 | Payer: MEDICARE

## 2019-02-11 DIAGNOSIS — D631 Anemia in chronic kidney disease: Secondary | ICD-10-CM

## 2019-02-11 DIAGNOSIS — D509 Iron deficiency anemia, unspecified: Secondary | ICD-10-CM

## 2019-02-11 DIAGNOSIS — N189 Chronic kidney disease, unspecified: Principal | ICD-10-CM

## 2019-02-11 DIAGNOSIS — N183 Chronic kidney disease, stage 3 (moderate): Secondary | ICD-10-CM

## 2019-02-17 ENCOUNTER — Institutional Professional Consult (permissible substitution): Admit: 2019-02-17 | Discharge: 2019-02-18 | Payer: MEDICARE

## 2019-02-17 DIAGNOSIS — N189 Chronic kidney disease, unspecified: Principal | ICD-10-CM

## 2019-02-17 DIAGNOSIS — N183 Chronic kidney disease, stage 3 (moderate): Secondary | ICD-10-CM

## 2019-02-17 DIAGNOSIS — D509 Iron deficiency anemia, unspecified: Secondary | ICD-10-CM

## 2019-02-17 DIAGNOSIS — D631 Anemia in chronic kidney disease: Secondary | ICD-10-CM

## 2019-02-28 MED ORDER — DULOXETINE 60 MG CAPSULE,DELAYED RELEASE
ORAL_CAPSULE | ORAL | 2 refills | 0 days | Status: CP
Start: 2019-02-28 — End: 2019-03-01

## 2019-02-28 MED ORDER — LEVOTHYROXINE 125 MCG TABLET
ORAL_TABLET | ORAL | 2 refills | 0 days | Status: CP
Start: 2019-02-28 — End: 2019-03-01

## 2019-03-01 MED ORDER — DULOXETINE 60 MG CAPSULE,DELAYED RELEASE
ORAL_CAPSULE | ORAL | 2 refills | 0 days | Status: CP
Start: 2019-03-01 — End: 2019-03-02

## 2019-03-01 MED ORDER — LEVOTHYROXINE 125 MCG TABLET
ORAL_TABLET | ORAL | 2 refills | 0 days | Status: CP
Start: 2019-03-01 — End: 2019-03-02

## 2019-03-02 MED ORDER — LEVOTHYROXINE 125 MCG TABLET
ORAL_TABLET | ORAL | 2 refills | 0 days | Status: CP
Start: 2019-03-02 — End: 2020-03-01

## 2019-03-02 MED ORDER — DULOXETINE 60 MG CAPSULE,DELAYED RELEASE
ORAL_CAPSULE | ORAL | 2 refills | 0 days | Status: CP
Start: 2019-03-02 — End: 2020-03-01

## 2019-03-14 ENCOUNTER — Institutional Professional Consult (permissible substitution): Admit: 2019-03-14 | Discharge: 2019-03-15 | Payer: MEDICARE

## 2019-03-14 DIAGNOSIS — R05 Cough: Principal | ICD-10-CM

## 2019-03-15 ENCOUNTER — Encounter: Admit: 2019-03-15 | Discharge: 2019-03-16 | Payer: MEDICARE | Attending: Family | Primary: Family

## 2019-03-15 DIAGNOSIS — Z20828 Contact with and (suspected) exposure to other viral communicable diseases: Principal | ICD-10-CM

## 2019-03-29 ENCOUNTER — Telehealth: Admit: 2019-03-29 | Discharge: 2019-03-30 | Payer: MEDICARE

## 2019-03-29 DIAGNOSIS — J479 Bronchiectasis, uncomplicated: Principal | ICD-10-CM

## 2019-03-29 DIAGNOSIS — R1319 Other dysphagia: Secondary | ICD-10-CM

## 2019-04-04 MED ORDER — MUCUS CLEARING DEVICE
Freq: Once | 0 refills | 0.00000 days | Status: CP
Start: 2019-04-04 — End: 2019-04-04

## 2019-04-29 ENCOUNTER — Encounter: Payer: Self-pay | Admitting: Emergency Medicine

## 2019-04-29 ENCOUNTER — Emergency Department
Admission: EM | Admit: 2019-04-29 | Discharge: 2019-04-30 | Disposition: A | Discharge status: Home / Self Care | Payer: Medicare Other | Attending: Emergency Medicine | Admitting: Emergency Medicine

## 2019-04-29 ENCOUNTER — Other Ambulatory Visit: Payer: Self-pay

## 2019-04-29 ENCOUNTER — Emergency Department: Payer: Medicare Other

## 2019-04-29 DIAGNOSIS — S46912A Strain of unspecified muscle, fascia and tendon at shoulder and upper arm level, left arm, initial encounter: Secondary | ICD-10-CM | POA: Diagnosis not present

## 2019-04-29 DIAGNOSIS — Z96651 Presence of right artificial knee joint: Secondary | ICD-10-CM | POA: Diagnosis not present

## 2019-04-29 DIAGNOSIS — Y998 Other external cause status: Secondary | ICD-10-CM | POA: Diagnosis not present

## 2019-04-29 DIAGNOSIS — Y92009 Unspecified place in unspecified non-institutional (private) residence as the place of occurrence of the external cause: Secondary | ICD-10-CM

## 2019-04-29 DIAGNOSIS — Z8521 Personal history of malignant neoplasm of larynx: Secondary | ICD-10-CM | POA: Diagnosis not present

## 2019-04-29 DIAGNOSIS — W010XXA Fall on same level from slipping, tripping and stumbling without subsequent striking against object, initial encounter: Secondary | ICD-10-CM | POA: Diagnosis not present

## 2019-04-29 DIAGNOSIS — Y9301 Activity, walking, marching and hiking: Secondary | ICD-10-CM | POA: Diagnosis not present

## 2019-04-29 DIAGNOSIS — Y92512 Supermarket, store or market as the place of occurrence of the external cause: Secondary | ICD-10-CM | POA: Diagnosis not present

## 2019-04-29 DIAGNOSIS — F17200 Nicotine dependence, unspecified, uncomplicated: Secondary | ICD-10-CM | POA: Insufficient documentation

## 2019-04-29 DIAGNOSIS — W19XXXA Unspecified fall, initial encounter: Secondary | ICD-10-CM

## 2019-04-29 DIAGNOSIS — S86912A Strain of unspecified muscle(s) and tendon(s) at lower leg level, left leg, initial encounter: Secondary | ICD-10-CM | POA: Diagnosis not present

## 2019-04-29 DIAGNOSIS — Z96652 Presence of left artificial knee joint: Secondary | ICD-10-CM | POA: Diagnosis not present

## 2019-04-29 DIAGNOSIS — S8992XA Unspecified injury of left lower leg, initial encounter: Secondary | ICD-10-CM | POA: Diagnosis present

## 2019-04-29 HISTORY — DX: Malignant (primary) neoplasm, unspecified: C80.1

## 2019-04-29 MED ORDER — CYCLOBENZAPRINE HCL 5 MG PO TABS: 5.0000 mg | ORAL_TABLET | Freq: Three times a day (TID) | ORAL | 0 refills | Status: AC | PRN

## 2019-04-29 MED ORDER — HYDROCODONE-ACETAMINOPHEN 5-325 MG PO TABS: 1.0000 | ORAL_TABLET | Freq: Three times a day (TID) | ORAL | 0 refills | Status: AC | PRN

## 2019-04-29 MED ORDER — HYDROCODONE-ACETAMINOPHEN 5-325 MG PO TABS
1.0000 | ORAL_TABLET | Freq: Once | ORAL | Status: AC
  Administered 2019-04-30: 1 via ORAL
  Filled 2019-04-29: qty 1

## 2019-04-29 NOTE — ED Triage Notes (Signed)
Patient states that he slipped and fell on Wednesday. Patient with complaint of left knee and shoulder pain.

## 2019-04-29 NOTE — ED Notes (Signed)
Pt has swelling to L knee; appropriate color/warmth/pulses; c/o pain in knee. L wrist radial pulse WDL. Pt can move arm but with limited movement d/t pain.

## 2019-04-29 NOTE — Discharge Instructions (Addendum)
Your exam and XRs are negative for any fracture or dislocation to the knee or shoulder. You do have underlying arthritis. Your knee sprain and stiffness is without any disruption of the hardware. Your shoulder appears to have a sprain or tendinitis. Take the prescription pain medicine and muscle relaxant as needed. Follow-up with Ortho for ongoing symptoms.

## 2019-04-29 NOTE — ED Provider Notes (Signed)
[facility] Emergency Department Provider Note ____________________________________________  Time seen: 2244  I have reviewed the triage vital signs and the nursing notes.  HISTORY  Chief Complaint  Fall  HPI Brandon Lucero is a 62 y.o. male presents to the ED for evaluation of left shoulder and left knee pain after mechanical fall.  Patient describes tripping and falling over a rug in a store on Wednesday.  He manages symptoms at home, but presents to the ED today with complaints of stiffness to the left knee and decreased range of motion of the left shoulder.  He denies any head injury, loss of conscious, nausea, vomiting, chest pain, or weakness. He is status post total knee replacements bilaterally, and has degenerative joint disease of the shoulders.  Patient is also a cancer survivor.  Past Medical History:  Diagnosis Date  . 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   Past Surgical History:  Procedure Laterality Date  . REPLACEMENT TOTAL KNEE Bilateral     Prior to Admission medications   Medication Sig Start Date End Date Taking? Authorizing Provider  cyclobenzaprine (FLEXERIL) 5 MG tablet Take 1 tablet (5 mg total) by mouth 3 (three) times daily as needed for up to 5 days. 04/29/19 05/04/19  Mykah Shin, Dannielle Karvonen, PA-C  HYDROcodone-acetaminophen (NORCO) 5-325 MG tablet Take 1 tablet by mouth 3 (three) times daily as needed for up to 3 days. 04/29/19 05/02/19  Manjinder Breau, Dannielle Karvonen, PA-C    Allergies Patient has no known allergies.  No family history on file.  Social History Social History   Tobacco Use  . Smoking status: Current Some Day Smoker  . Smokeless tobacco: Never Used  Substance Use Topics  . Alcohol use: Not on file  . Drug use: Not on file    Review of Systems  Constitutional: Negative for fever. Eyes: Negative for visual changes. ENT: Negative for sore throat. Cardiovascular:  Negative for chest pain. Respiratory: Negative for shortness of breath. Gastrointestinal: Negative for abdominal pain, vomiting and diarrhea. Genitourinary: Negative for dysuria. Musculoskeletal: Negative for back pain.  Left knee and left shoulder pain as above. Skin: Negative for rash. Neurological: Negative for headaches, focal weakness or numbness. ____________________________________________  PHYSICAL EXAM:  VITAL SIGNS: ED Triage Vitals  Enc Vitals Group     BP 04/29/19 2111 (!) 189/107     Pulse Rate 04/29/19 2111 77     Resp 04/29/19 2111 18     Temp 04/29/19 2111 98.6 F (37 C)     Temp Source 04/29/19 2111 Oral     SpO2 04/29/19 2111 95 %     Weight 04/29/19 2110 160 lb (72.6 kg)     Height 04/29/19 2110 6\' 3"  (1.905 m)     Head Circumference --      Peak Flow --      Pain Score 04/29/19 2110 9     Pain Loc --      Pain Edu? --      Excl. in [place]? --     Constitutional: Alert and oriented. Well appearing and in no distress. Head: Normocephalic and atraumatic. Eyes: Conjunctivae are normal. Normal extraocular movements Cardiovascular: Normal rate, regular rhythm. Normal distal pulses. Respiratory: Normal respiratory effort.  Musculoskeletal: Left shoulder without any obvious deformity, dislocation, or sulcus sign.  Patient with decreased active range of motion with abduction to 75 degrees.  Extension range is also limited to  90 degrees actively.  Patient is nontender to palpation to the anterior deltoid region.  He has AC joint arthritis but no palpable pain or separation.  Normal composite fist distally.  The left knee is without obvious deformity or dislocation.  Cysts midline scar consistent with a total knee replacement.  He has some mild joint effusion noted, but is able demonstrate normal flexion and extension range.  No popliteal space fullness is noted.  Nontender with normal range of motion in all extremities.  Neurologic:  Normal gait without ataxia. Normal  speech and language. No gross focal neurologic deficits are appreciated. Skin:  Skin is warm, dry and intact. No rash noted. Psychiatric: Mood and affect are normal. Patient exhibits appropriate insight and judgment. ____________________________________________  PROCEDURES  Procedures Norco 5-325 mg PO Arm sling ____________________________________________  INITIAL IMPRESSION / ASSESSMENT AND PLAN / ED COURSE  Brandon Lucero was evaluated in Emergency Department on 04/29/2019 for the symptoms described in the history of present illness. He was evaluated in the context of the global COVID-19 pandemic, which necessitated consideration that the patient might be at risk for infection with the SARS-CoV-2 virus that causes COVID-19. Institutional protocols and algorithms that pertain to the evaluation of patients at risk for COVID-19 are in a state of rapid change based on information released by regulatory bodies including the CDC and federal and state organizations. These policies and algorithms were followed during the patient's care in the ED.  Patient with ED evaluation of injury sustained following mechanical fall.  Patient clinical picture is consistent with a shoulder strain with possible rotator cuff disruption. The knee exam is also consistent with knee sprain but no indication of any internal derangement or hardware disruption.  Patient is placed in arm sling for comfort.  A prescription for #9 hydrocodone is provided for his benefit.  He is also given Flexeril for spasm pain relief.  He is referred to orthopedics for ongoing symptom management.  Return precautions have been reviewed. ____________________________________________  FINAL CLINICAL IMPRESSION(S) / ED DIAGNOSES  Final diagnoses:  Fall in home, initial encounter  Knee strain, left, initial encounter  Shoulder strain, left, initial encounter      Melvenia Needles, PA-C 04/29/19 2329    Vanessa Troy, MD 04/30/19  [phone]

## 2019-06-22 DIAGNOSIS — D631 Anemia in chronic kidney disease: Principal | ICD-10-CM

## 2019-06-22 DIAGNOSIS — F321 Major depressive disorder, single episode, moderate: Principal | ICD-10-CM

## 2019-06-22 DIAGNOSIS — N189 Chronic kidney disease, unspecified: Principal | ICD-10-CM

## 2019-08-30 DIAGNOSIS — M25512 Pain in left shoulder: Principal | ICD-10-CM

## 2019-09-08 ENCOUNTER — Encounter: Admit: 2019-09-08 | Discharge: 2019-09-09 | Payer: MEDICARE | Attending: Family | Primary: Family

## 2019-09-30 ENCOUNTER — Other Ambulatory Visit: Admit: 2019-09-30 | Discharge: 2019-09-30 | Payer: MEDICARE

## 2019-09-30 ENCOUNTER — Encounter: Admit: 2019-09-30 | Discharge: 2019-09-30 | Payer: MEDICARE | Attending: Otolaryngology | Primary: Otolaryngology

## 2019-09-30 DIAGNOSIS — Z9221 Personal history of antineoplastic chemotherapy: Principal | ICD-10-CM

## 2019-09-30 DIAGNOSIS — C109 Malignant neoplasm of oropharynx, unspecified: Principal | ICD-10-CM

## 2019-09-30 DIAGNOSIS — Z923 Personal history of irradiation: Principal | ICD-10-CM

## 2019-09-30 MED ORDER — GABAPENTIN 300 MG CAPSULE
ORAL_CAPSULE | Freq: Three times a day (TID) | ORAL | 2 refills | 90 days | Status: CP
Start: 2019-09-30 — End: 2020-09-29

## 2019-10-05 ENCOUNTER — Encounter: Admit: 2019-10-05 | Discharge: 2019-10-06 | Payer: MEDICARE

## 2019-10-05 ENCOUNTER — Encounter
Admit: 2019-10-05 | Discharge: 2019-10-06 | Payer: MEDICARE | Attending: Foot & Ankle Surgery | Primary: Foot & Ankle Surgery

## 2019-10-13 MED ORDER — DICLOFENAC 1 % TOPICAL GEL
Freq: Four times a day (QID) | TOPICAL | 5 refills | 25 days | Status: CP | PRN
Start: 2019-10-13 — End: 2020-10-12

## 2019-10-14 MED ORDER — VARENICLINE 1 MG TABLET
ORAL_TABLET | Freq: Two times a day (BID) | ORAL | 2 refills | 30.00000 days | Status: CP
Start: 2019-10-14 — End: 2020-01-12

## 2019-11-02 ENCOUNTER — Encounter
Admit: 2019-11-02 | Discharge: 2019-11-03 | Payer: MEDICARE | Attending: Foot & Ankle Surgery | Primary: Foot & Ankle Surgery

## 2019-11-02 DIAGNOSIS — M2042 Other hammer toe(s) (acquired), left foot: Secondary | ICD-10-CM

## 2019-11-02 DIAGNOSIS — M2041 Other hammer toe(s) (acquired), right foot: Principal | ICD-10-CM

## 2019-11-02 DIAGNOSIS — M21611 Bunion of right foot: Principal | ICD-10-CM

## 2019-11-03 DIAGNOSIS — C109 Malignant neoplasm of oropharynx, unspecified: Principal | ICD-10-CM

## 2019-11-15 MED ORDER — VARENICLINE 0.5 MG (11)-1 MG (42) TABLETS IN A DOSE PACK
0 refills | 0 days | Status: CP
Start: 2019-11-15 — End: 2020-02-13

## 2019-11-25 ENCOUNTER — Ambulatory Visit: Payer: Medicare Other | Attending: Internal Medicine

## 2019-11-25 DIAGNOSIS — Z23 Encounter for immunization: Secondary | ICD-10-CM

## 2019-11-25 NOTE — Progress Notes (Signed)
   Covid-19 Vaccination Clinic  Name:  Brandon Lucero    MRN: [id number] DOB: [birth year]  11/25/2019  [name] was observed post Covid-19 immunization for 15 minutes without incident. He was provided with Vaccine Information Sheet and instruction to access the V-Safe system.   [name] was instructed to call 911 with any severe reactions post vaccine: Marland Kitchen Difficulty breathing  . Swelling of face and throat  . A fast heartbeat  . A bad rash all over body  . Dizziness and weakness   Immunizations Administered    Name Date Dose VIS Date Route   Pfizer COVID-19 Vaccine 11/25/2019 10:57 AM 0.3 mL 08/12/2019 Intramuscular   Manufacturer: [place]   Lot: [id number]   [place]: [id number]

## 2019-12-20 ENCOUNTER — Ambulatory Visit: Payer: Medicare Other | Attending: Internal Medicine

## 2019-12-20 DIAGNOSIS — Z23 Encounter for immunization: Secondary | ICD-10-CM

## 2019-12-20 NOTE — Progress Notes (Signed)
   Covid-19 Vaccination Clinic  Name:  RIC CRAFTS    MRN: [id number] DOB: 1955/09/15  12/20/2019  Mr. Randles was observed post Covid-19 immunization for 15 minutes without incident. He was provided with Vaccine Information Sheet and instruction to access the V-Safe system.   Mr. Massar was instructed to call 911 with any severe reactions post vaccine: Marland Kitchen Difficulty breathing  . Swelling of face and throat  . A fast heartbeat  . A bad rash all over body  . Dizziness and weakness   Immunizations Administered    Name Date Dose VIS Date Route   Pfizer COVID-19 Vaccine 12/20/2019 10:45 AM 0.3 mL 10/26/2018 Intramuscular   Manufacturer: [place]   Lot: [id number]   [place]: [id number]

## 2020-01-20 ENCOUNTER — Ambulatory Visit: Admit: 2020-01-20 | Payer: MEDICARE | Attending: Otolaryngology | Primary: Otolaryngology

## 2020-04-21 IMAGING — CR LEFT SHOULDER - 2+ VIEW
1 series · 3 of 3 positions shown · non-contrast
Comparison: None.

CLINICAL DATA: Fall, shoulder pain

EXAM:
LEFT SHOULDER - 2+ VIEW

[Series 1: dg shoulder left · 0.14mm/px · 3 of 3 slices shown]
[im 1/3]
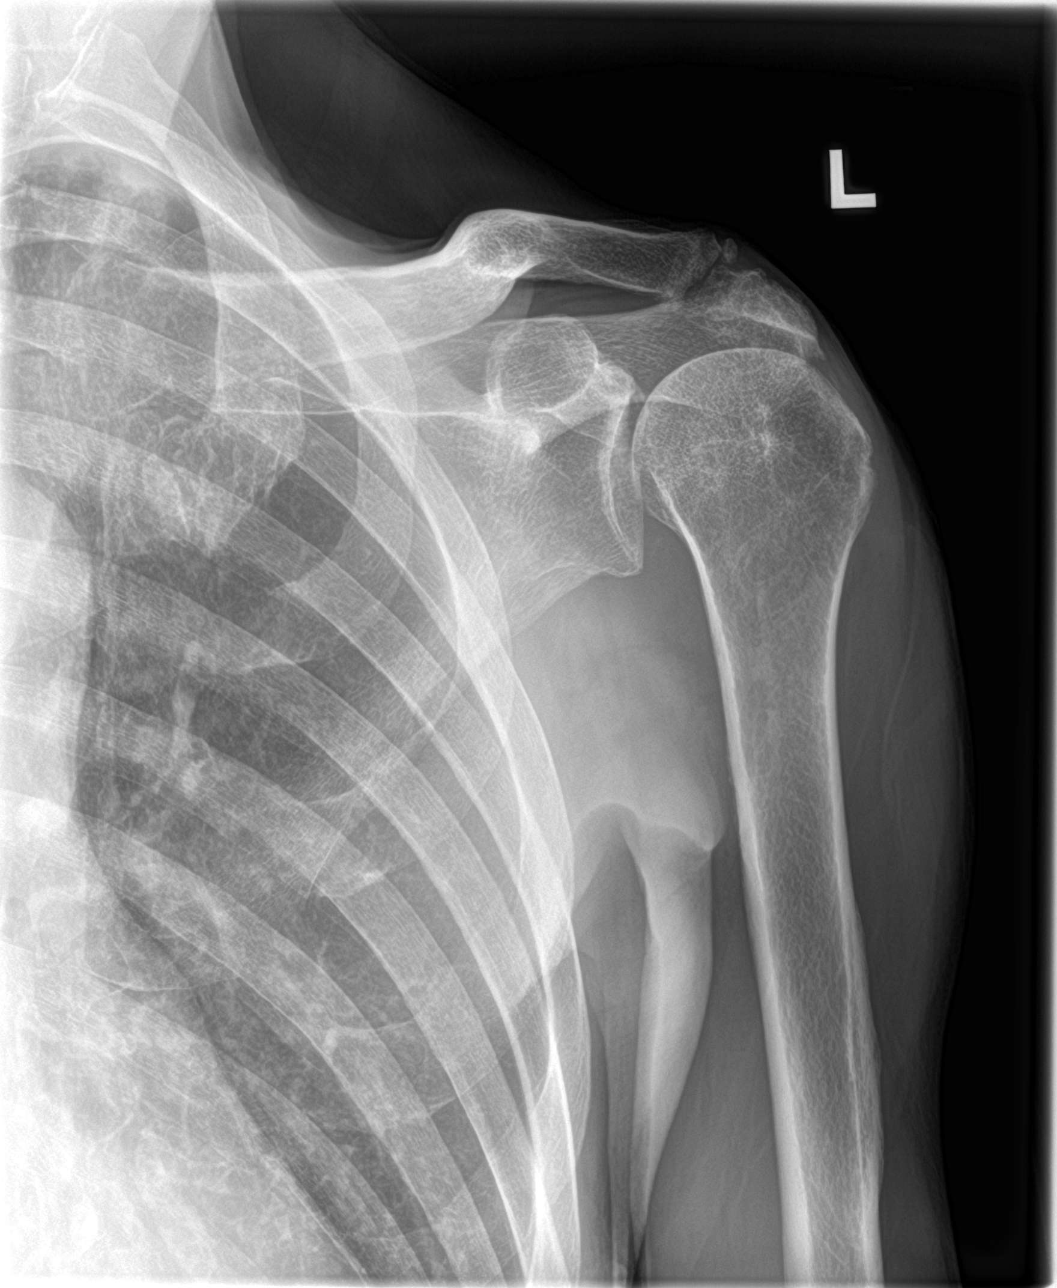
[im 2/3]
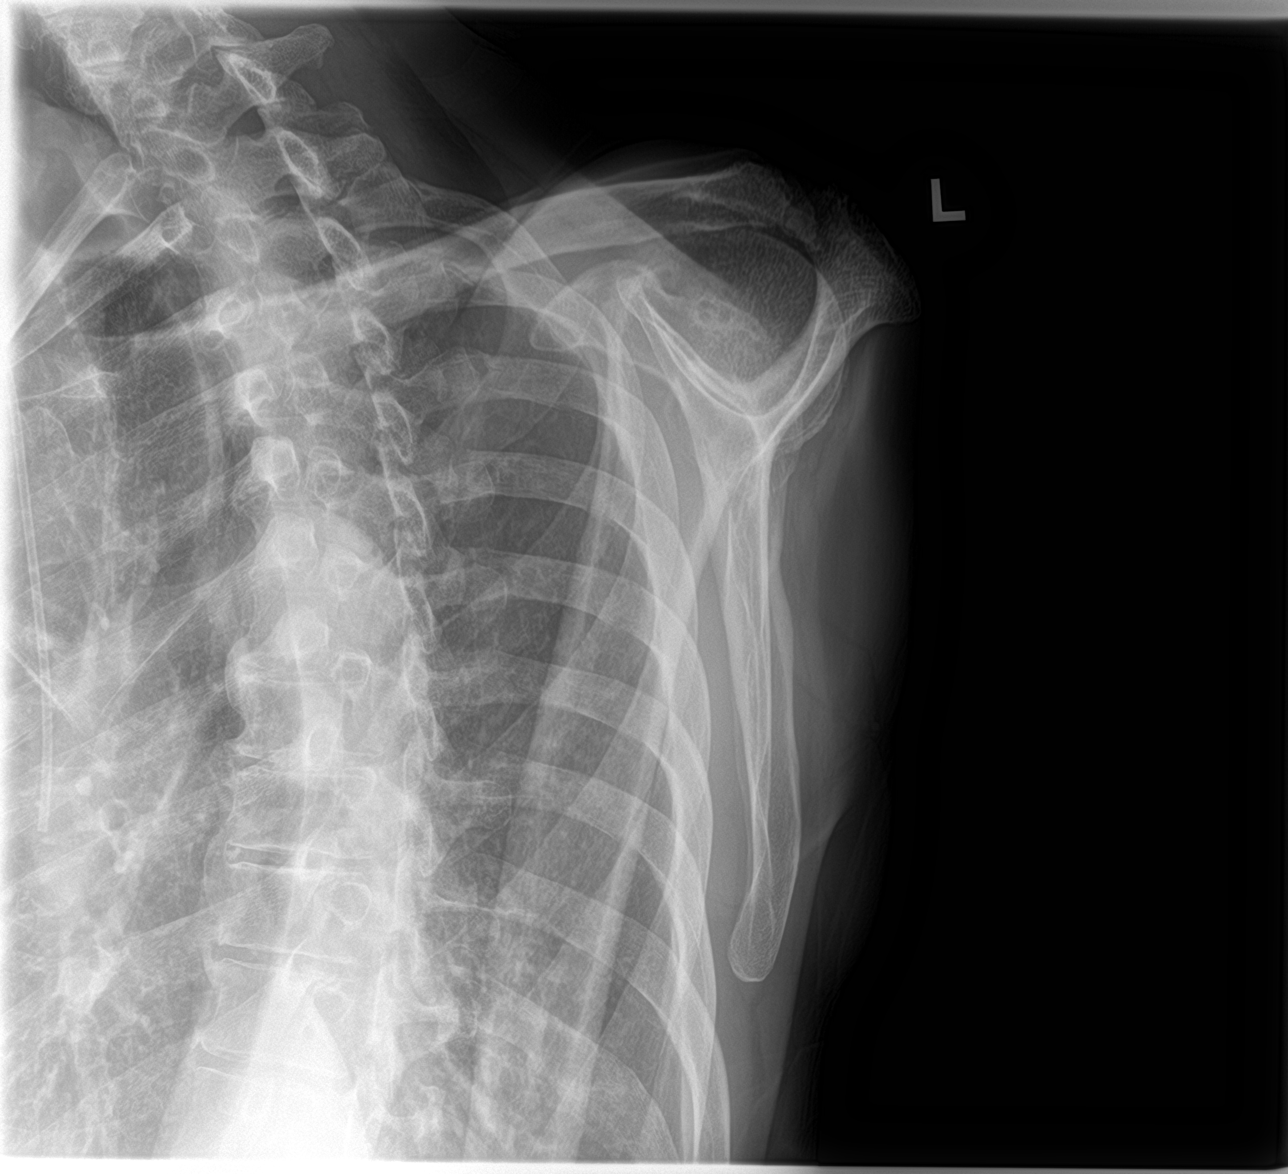
[im 3/3]
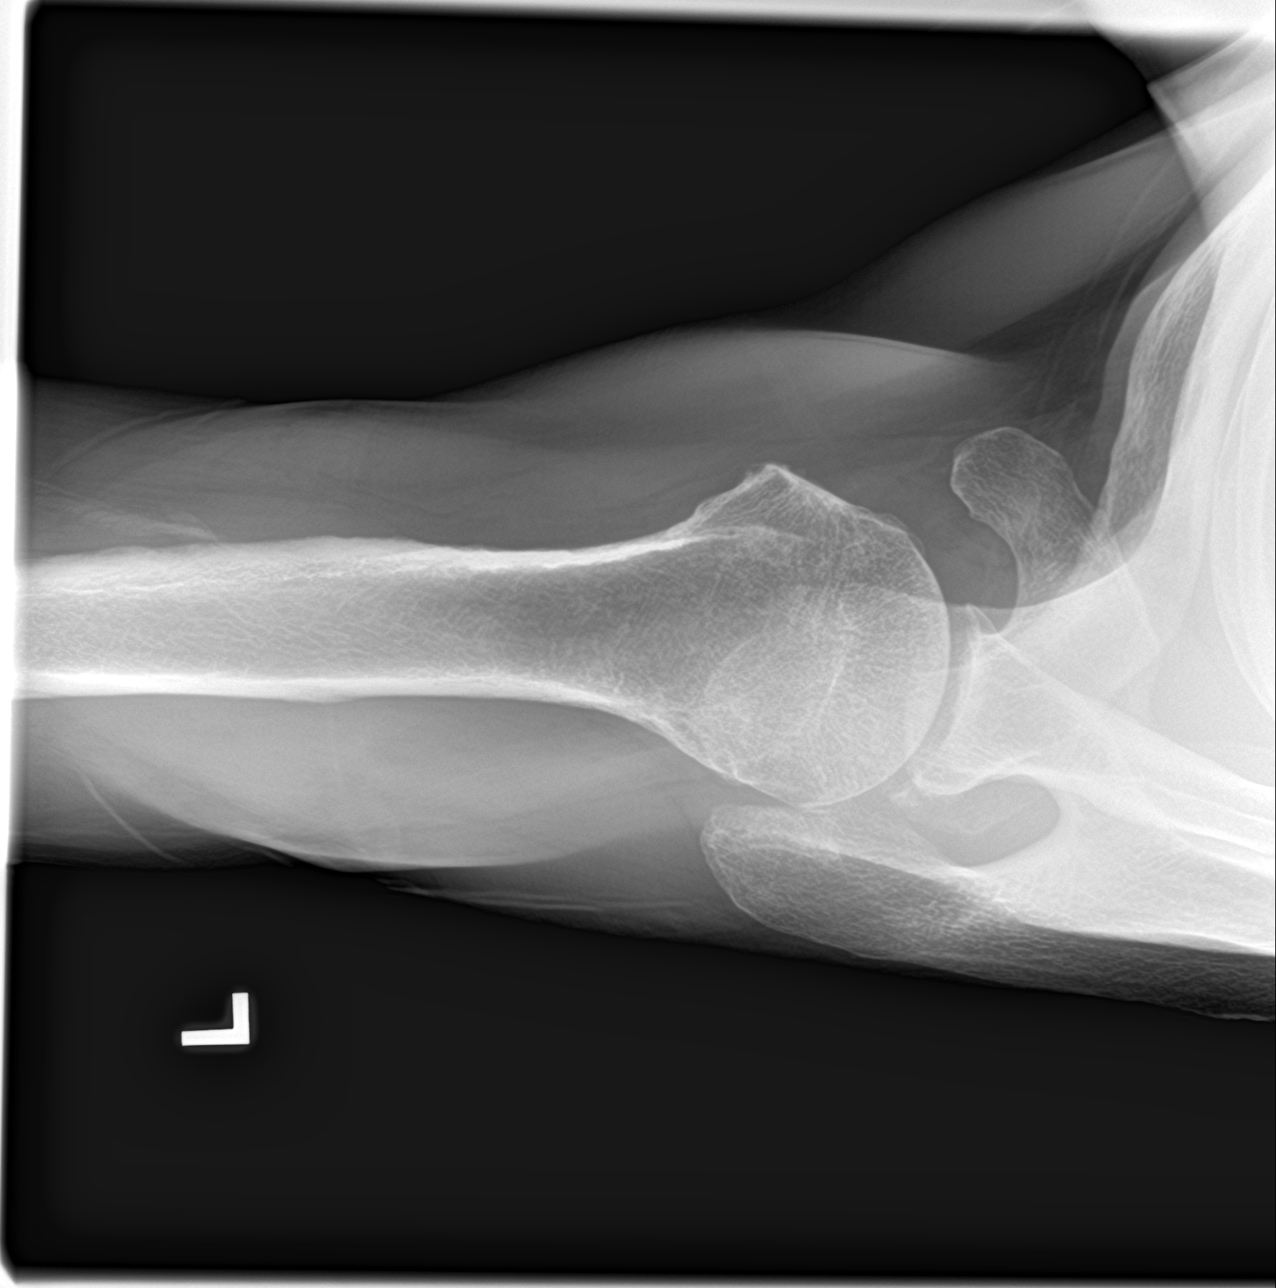

[3 of 3 positions shown; findings below may reference images not displayed]

FINDINGS: Degenerative changes in the AC joint with joint space narrowing and
spurring. Glenohumeral joint is maintained. No acute bony
abnormality. Specifically, no fracture, subluxation, or dislocation.
Soft tissues are intact.
IMPRESSION: Moderate degenerative changes in the left AC joint. No acute bony
abnormality.

## 2020-05-31 ENCOUNTER — Ambulatory Visit
Admit: 2020-05-31 | Discharge: 2020-06-01 | Payer: MEDICARE | Attending: Student in an Organized Health Care Education/Training Program | Primary: Student in an Organized Health Care Education/Training Program

## 2020-05-31 ENCOUNTER — Ambulatory Visit: Admit: 2020-05-31 | Discharge: 2020-06-01 | Payer: MEDICARE

## 2020-05-31 DIAGNOSIS — N189 Chronic kidney disease, unspecified: Principal | ICD-10-CM

## 2020-05-31 DIAGNOSIS — Z87891 Personal history of nicotine dependence: Principal | ICD-10-CM

## 2020-05-31 DIAGNOSIS — D631 Anemia in chronic kidney disease: Secondary | ICD-10-CM

## 2020-05-31 DIAGNOSIS — R200 Anesthesia of skin: Principal | ICD-10-CM

## 2020-05-31 DIAGNOSIS — R634 Abnormal weight loss: Principal | ICD-10-CM

## 2020-06-11 ENCOUNTER — Ambulatory Visit: Admit: 2020-06-11 | Discharge: 2020-06-12 | Payer: MEDICARE

## 2020-06-13 ENCOUNTER — Ambulatory Visit
Admit: 2020-06-13 | Discharge: 2020-06-14 | Payer: MEDICARE | Attending: Student in an Organized Health Care Education/Training Program | Primary: Student in an Organized Health Care Education/Training Program

## 2020-06-13 DIAGNOSIS — R634 Abnormal weight loss: Principal | ICD-10-CM

## 2020-06-13 DIAGNOSIS — R0600 Dyspnea, unspecified: Principal | ICD-10-CM

## 2020-06-13 DIAGNOSIS — J189 Pneumonia, unspecified organism: Principal | ICD-10-CM

## 2020-06-13 DIAGNOSIS — R200 Anesthesia of skin: Principal | ICD-10-CM

## 2020-06-13 MED ORDER — AMOXICILLIN 875 MG-POTASSIUM CLAVULANATE 125 MG TABLET
ORAL_TABLET | Freq: Two times a day (BID) | ORAL | 0 refills | 5 days | Status: CP
Start: 2020-06-13 — End: 2020-06-18

## 2020-06-13 MED ORDER — AZITHROMYCIN 250 MG TABLET
ORAL_TABLET | Freq: Every day | ORAL | 0 refills | 3 days | Status: CP
Start: 2020-06-13 — End: 2020-06-16

## 2020-06-13 MED ORDER — BUDESONIDE-FORMOTEROL HFA 80 MCG-4.5 MCG/ACTUATION AEROSOL INHALER
RESPIRATORY_TRACT | 0 refills | 0.00000 days | Status: CP
Start: 2020-06-13 — End: ?

## 2020-06-21 ENCOUNTER — Ambulatory Visit: Admit: 2020-06-21 | Payer: MEDICARE | Attending: Registered" | Primary: Registered"

## 2020-08-13 ENCOUNTER — Ambulatory Visit: Admit: 2020-08-13 | Discharge: 2020-08-14 | Payer: MEDICARE

## 2020-08-13 DIAGNOSIS — R634 Abnormal weight loss: Principal | ICD-10-CM

## 2020-08-22 ENCOUNTER — Ambulatory Visit: Admit: 2020-08-22 | Discharge: 2020-08-23 | Payer: MEDICARE

## 2020-08-22 DIAGNOSIS — R200 Anesthesia of skin: Principal | ICD-10-CM

## 2020-09-11 ENCOUNTER — Ambulatory Visit: Admit: 2020-09-11 | Discharge: 2020-09-12 | Payer: MEDICARE | Attending: Otolaryngology | Primary: Otolaryngology

## 2020-09-11 DIAGNOSIS — Z923 Personal history of irradiation: Principal | ICD-10-CM

## 2020-09-11 DIAGNOSIS — R1310 Dysphagia, unspecified: Principal | ICD-10-CM

## 2020-09-11 DIAGNOSIS — Z85818 Personal history of malignant neoplasm of other sites of lip, oral cavity, and pharynx: Principal | ICD-10-CM

## 2020-09-11 DIAGNOSIS — F17210 Nicotine dependence, cigarettes, uncomplicated: Principal | ICD-10-CM

## 2020-09-11 DIAGNOSIS — Z08 Encounter for follow-up examination after completed treatment for malignant neoplasm: Principal | ICD-10-CM

## 2020-09-11 DIAGNOSIS — C109 Malignant neoplasm of oropharynx, unspecified: Principal | ICD-10-CM

## 2020-09-11 DIAGNOSIS — Z9221 Personal history of antineoplastic chemotherapy: Principal | ICD-10-CM

## 2020-09-28 ENCOUNTER — Ambulatory Visit: Admit: 2020-09-28 | Discharge: 2020-09-29 | Payer: MEDICARE

## 2020-09-28 ENCOUNTER — Ambulatory Visit: Admit: 2020-09-28 | Discharge: 2020-09-29 | Payer: MEDICARE | Attending: Otolaryngology | Primary: Otolaryngology

## 2020-09-28 DIAGNOSIS — K1233 Oral mucositis (ulcerative) due to radiation: Principal | ICD-10-CM

## 2020-09-28 DIAGNOSIS — K219 Gastro-esophageal reflux disease without esophagitis: Principal | ICD-10-CM

## 2020-09-28 DIAGNOSIS — F17210 Nicotine dependence, cigarettes, uncomplicated: Principal | ICD-10-CM

## 2020-09-28 DIAGNOSIS — Z87891 Personal history of nicotine dependence: Principal | ICD-10-CM

## 2020-09-28 DIAGNOSIS — Z9221 Personal history of antineoplastic chemotherapy: Principal | ICD-10-CM

## 2020-09-28 DIAGNOSIS — Z923 Personal history of irradiation: Principal | ICD-10-CM

## 2020-09-28 DIAGNOSIS — R221 Localized swelling, mass and lump, neck: Principal | ICD-10-CM

## 2020-09-28 DIAGNOSIS — C109 Malignant neoplasm of oropharynx, unspecified: Principal | ICD-10-CM

## 2020-09-28 DIAGNOSIS — N183 Chronic kidney disease, stage 3 unspecified: Principal | ICD-10-CM

## 2020-09-28 DIAGNOSIS — J384 Edema of larynx: Principal | ICD-10-CM

## 2020-09-28 DIAGNOSIS — G4700 Insomnia, unspecified: Principal | ICD-10-CM

## 2020-09-28 DIAGNOSIS — E039 Hypothyroidism, unspecified: Principal | ICD-10-CM

## 2020-09-28 DIAGNOSIS — R1310 Dysphagia, unspecified: Principal | ICD-10-CM

## 2020-09-28 DIAGNOSIS — L89151 Pressure ulcer of sacral region, stage 1: Principal | ICD-10-CM

## 2020-09-28 DIAGNOSIS — R634 Abnormal weight loss: Principal | ICD-10-CM

## 2020-09-28 DIAGNOSIS — E43 Unspecified severe protein-calorie malnutrition: Principal | ICD-10-CM

## 2020-09-28 DIAGNOSIS — M1990 Unspecified osteoarthritis, unspecified site: Principal | ICD-10-CM

## 2020-09-28 DIAGNOSIS — Z681 Body mass index (BMI) 19 or less, adult: Principal | ICD-10-CM

## 2020-09-28 DIAGNOSIS — Z01818 Encounter for other preprocedural examination: Principal | ICD-10-CM

## 2020-09-28 DIAGNOSIS — Z803 Family history of malignant neoplasm of breast: Principal | ICD-10-CM

## 2020-09-28 DIAGNOSIS — R64 Cachexia: Principal | ICD-10-CM

## 2020-09-28 DIAGNOSIS — K1230 Oral mucositis (ulcerative), unspecified: Principal | ICD-10-CM

## 2020-09-28 DIAGNOSIS — F32A Depression, unspecified: Principal | ICD-10-CM

## 2020-10-02 DIAGNOSIS — F32A Depression, unspecified: Principal | ICD-10-CM

## 2020-10-02 DIAGNOSIS — Z803 Family history of malignant neoplasm of breast: Principal | ICD-10-CM

## 2020-10-02 DIAGNOSIS — Z681 Body mass index (BMI) 19 or less, adult: Principal | ICD-10-CM

## 2020-10-02 DIAGNOSIS — Z87891 Personal history of nicotine dependence: Principal | ICD-10-CM

## 2020-10-02 DIAGNOSIS — R64 Cachexia: Principal | ICD-10-CM

## 2020-10-02 DIAGNOSIS — J384 Edema of larynx: Principal | ICD-10-CM

## 2020-10-02 DIAGNOSIS — L89151 Pressure ulcer of sacral region, stage 1: Principal | ICD-10-CM

## 2020-10-02 DIAGNOSIS — Z923 Personal history of irradiation: Principal | ICD-10-CM

## 2020-10-02 DIAGNOSIS — G4700 Insomnia, unspecified: Principal | ICD-10-CM

## 2020-10-02 DIAGNOSIS — K1233 Oral mucositis (ulcerative) due to radiation: Principal | ICD-10-CM

## 2020-10-02 DIAGNOSIS — Z9221 Personal history of antineoplastic chemotherapy: Principal | ICD-10-CM

## 2020-10-02 DIAGNOSIS — K1230 Oral mucositis (ulcerative), unspecified: Principal | ICD-10-CM

## 2020-10-02 DIAGNOSIS — K219 Gastro-esophageal reflux disease without esophagitis: Principal | ICD-10-CM

## 2020-10-02 DIAGNOSIS — E039 Hypothyroidism, unspecified: Principal | ICD-10-CM

## 2020-10-02 DIAGNOSIS — M1990 Unspecified osteoarthritis, unspecified site: Principal | ICD-10-CM

## 2020-10-02 DIAGNOSIS — N183 Chronic kidney disease, stage 3 unspecified: Principal | ICD-10-CM

## 2020-10-02 DIAGNOSIS — E43 Unspecified severe protein-calorie malnutrition: Principal | ICD-10-CM

## 2020-10-02 DIAGNOSIS — C109 Malignant neoplasm of oropharynx, unspecified: Principal | ICD-10-CM

## 2020-10-02 DIAGNOSIS — R221 Localized swelling, mass and lump, neck: Principal | ICD-10-CM

## 2020-10-03 ENCOUNTER — Ambulatory Visit
Admit: 2020-10-03 | Discharge: 2020-10-08 | Disposition: A | Discharge status: Home / Self Care | Payer: MEDICARE | Admitting: Otolaryngology

## 2020-10-03 ENCOUNTER — Encounter
Admit: 2020-10-03 | Discharge: 2020-10-08 | Disposition: A | Discharge status: Home / Self Care | Payer: MEDICARE | Admitting: Otolaryngology

## 2020-10-03 DIAGNOSIS — F32A Depression, unspecified: Principal | ICD-10-CM

## 2020-10-03 DIAGNOSIS — E43 Unspecified severe protein-calorie malnutrition: Principal | ICD-10-CM

## 2020-10-03 DIAGNOSIS — Z923 Personal history of irradiation: Principal | ICD-10-CM

## 2020-10-03 DIAGNOSIS — K1233 Oral mucositis (ulcerative) due to radiation: Principal | ICD-10-CM

## 2020-10-03 DIAGNOSIS — C109 Malignant neoplasm of oropharynx, unspecified: Principal | ICD-10-CM

## 2020-10-03 DIAGNOSIS — K1230 Oral mucositis (ulcerative), unspecified: Principal | ICD-10-CM

## 2020-10-03 DIAGNOSIS — J384 Edema of larynx: Principal | ICD-10-CM

## 2020-10-03 DIAGNOSIS — Z87891 Personal history of nicotine dependence: Principal | ICD-10-CM

## 2020-10-03 DIAGNOSIS — Z803 Family history of malignant neoplasm of breast: Principal | ICD-10-CM

## 2020-10-03 DIAGNOSIS — N183 Chronic kidney disease, stage 3 unspecified: Principal | ICD-10-CM

## 2020-10-03 DIAGNOSIS — R64 Cachexia: Principal | ICD-10-CM

## 2020-10-03 DIAGNOSIS — Z681 Body mass index (BMI) 19 or less, adult: Principal | ICD-10-CM

## 2020-10-03 DIAGNOSIS — L89151 Pressure ulcer of sacral region, stage 1: Principal | ICD-10-CM

## 2020-10-03 DIAGNOSIS — K219 Gastro-esophageal reflux disease without esophagitis: Principal | ICD-10-CM

## 2020-10-03 DIAGNOSIS — R221 Localized swelling, mass and lump, neck: Principal | ICD-10-CM

## 2020-10-03 DIAGNOSIS — M1990 Unspecified osteoarthritis, unspecified site: Principal | ICD-10-CM

## 2020-10-03 DIAGNOSIS — Z9221 Personal history of antineoplastic chemotherapy: Principal | ICD-10-CM

## 2020-10-03 DIAGNOSIS — G4700 Insomnia, unspecified: Principal | ICD-10-CM

## 2020-10-03 DIAGNOSIS — E039 Hypothyroidism, unspecified: Principal | ICD-10-CM

## 2020-10-03 MED ORDER — OXYCODONE 5 MG/5 ML ORAL SOLUTION
ORAL | 0 refills | 2.00000 days | Status: CP | PRN
Start: 2020-10-03 — End: 2020-10-16

## 2020-10-04 DIAGNOSIS — K1233 Oral mucositis (ulcerative) due to radiation: Principal | ICD-10-CM

## 2020-10-04 DIAGNOSIS — R64 Cachexia: Principal | ICD-10-CM

## 2020-10-04 DIAGNOSIS — Z923 Personal history of irradiation: Principal | ICD-10-CM

## 2020-10-04 DIAGNOSIS — R221 Localized swelling, mass and lump, neck: Principal | ICD-10-CM

## 2020-10-04 DIAGNOSIS — E039 Hypothyroidism, unspecified: Principal | ICD-10-CM

## 2020-10-04 DIAGNOSIS — K1230 Oral mucositis (ulcerative), unspecified: Principal | ICD-10-CM

## 2020-10-04 DIAGNOSIS — C109 Malignant neoplasm of oropharynx, unspecified: Principal | ICD-10-CM

## 2020-10-04 DIAGNOSIS — Z87891 Personal history of nicotine dependence: Principal | ICD-10-CM

## 2020-10-04 DIAGNOSIS — L89151 Pressure ulcer of sacral region, stage 1: Principal | ICD-10-CM

## 2020-10-04 DIAGNOSIS — K219 Gastro-esophageal reflux disease without esophagitis: Principal | ICD-10-CM

## 2020-10-04 DIAGNOSIS — G4700 Insomnia, unspecified: Principal | ICD-10-CM

## 2020-10-04 DIAGNOSIS — Z9221 Personal history of antineoplastic chemotherapy: Principal | ICD-10-CM

## 2020-10-04 DIAGNOSIS — Z803 Family history of malignant neoplasm of breast: Principal | ICD-10-CM

## 2020-10-04 DIAGNOSIS — M1990 Unspecified osteoarthritis, unspecified site: Principal | ICD-10-CM

## 2020-10-04 DIAGNOSIS — J384 Edema of larynx: Principal | ICD-10-CM

## 2020-10-04 DIAGNOSIS — Z681 Body mass index (BMI) 19 or less, adult: Principal | ICD-10-CM

## 2020-10-04 DIAGNOSIS — E43 Unspecified severe protein-calorie malnutrition: Principal | ICD-10-CM

## 2020-10-04 DIAGNOSIS — N183 Chronic kidney disease, stage 3 unspecified: Principal | ICD-10-CM

## 2020-10-04 DIAGNOSIS — F32A Depression, unspecified: Principal | ICD-10-CM

## 2020-10-05 DIAGNOSIS — E039 Hypothyroidism, unspecified: Principal | ICD-10-CM

## 2020-10-05 DIAGNOSIS — K1230 Oral mucositis (ulcerative), unspecified: Principal | ICD-10-CM

## 2020-10-05 DIAGNOSIS — L89151 Pressure ulcer of sacral region, stage 1: Principal | ICD-10-CM

## 2020-10-05 DIAGNOSIS — K1233 Oral mucositis (ulcerative) due to radiation: Principal | ICD-10-CM

## 2020-10-05 DIAGNOSIS — K219 Gastro-esophageal reflux disease without esophagitis: Principal | ICD-10-CM

## 2020-10-05 DIAGNOSIS — M1990 Unspecified osteoarthritis, unspecified site: Principal | ICD-10-CM

## 2020-10-05 DIAGNOSIS — E43 Unspecified severe protein-calorie malnutrition: Principal | ICD-10-CM

## 2020-10-05 DIAGNOSIS — G4700 Insomnia, unspecified: Principal | ICD-10-CM

## 2020-10-05 DIAGNOSIS — C109 Malignant neoplasm of oropharynx, unspecified: Principal | ICD-10-CM

## 2020-10-05 DIAGNOSIS — F32A Depression, unspecified: Principal | ICD-10-CM

## 2020-10-05 DIAGNOSIS — N183 Chronic kidney disease, stage 3 unspecified: Principal | ICD-10-CM

## 2020-10-05 DIAGNOSIS — Z87891 Personal history of nicotine dependence: Principal | ICD-10-CM

## 2020-10-05 DIAGNOSIS — J384 Edema of larynx: Principal | ICD-10-CM

## 2020-10-05 DIAGNOSIS — R221 Localized swelling, mass and lump, neck: Principal | ICD-10-CM

## 2020-10-05 DIAGNOSIS — Z9221 Personal history of antineoplastic chemotherapy: Principal | ICD-10-CM

## 2020-10-05 DIAGNOSIS — Z681 Body mass index (BMI) 19 or less, adult: Principal | ICD-10-CM

## 2020-10-05 DIAGNOSIS — Z923 Personal history of irradiation: Principal | ICD-10-CM

## 2020-10-05 DIAGNOSIS — Z803 Family history of malignant neoplasm of breast: Principal | ICD-10-CM

## 2020-10-05 DIAGNOSIS — R64 Cachexia: Principal | ICD-10-CM

## 2020-10-08 DIAGNOSIS — K1230 Oral mucositis (ulcerative), unspecified: Principal | ICD-10-CM

## 2020-10-08 DIAGNOSIS — R221 Localized swelling, mass and lump, neck: Principal | ICD-10-CM

## 2020-10-08 DIAGNOSIS — Z803 Family history of malignant neoplasm of breast: Principal | ICD-10-CM

## 2020-10-08 DIAGNOSIS — K1233 Oral mucositis (ulcerative) due to radiation: Principal | ICD-10-CM

## 2020-10-08 DIAGNOSIS — F32A Depression, unspecified: Principal | ICD-10-CM

## 2020-10-08 DIAGNOSIS — Z87891 Personal history of nicotine dependence: Principal | ICD-10-CM

## 2020-10-08 DIAGNOSIS — E039 Hypothyroidism, unspecified: Principal | ICD-10-CM

## 2020-10-08 DIAGNOSIS — L89151 Pressure ulcer of sacral region, stage 1: Principal | ICD-10-CM

## 2020-10-08 DIAGNOSIS — J384 Edema of larynx: Principal | ICD-10-CM

## 2020-10-08 DIAGNOSIS — M1990 Unspecified osteoarthritis, unspecified site: Principal | ICD-10-CM

## 2020-10-08 DIAGNOSIS — E43 Unspecified severe protein-calorie malnutrition: Principal | ICD-10-CM

## 2020-10-08 DIAGNOSIS — N183 Chronic kidney disease, stage 3 unspecified: Principal | ICD-10-CM

## 2020-10-08 DIAGNOSIS — Z9221 Personal history of antineoplastic chemotherapy: Principal | ICD-10-CM

## 2020-10-08 DIAGNOSIS — Z681 Body mass index (BMI) 19 or less, adult: Principal | ICD-10-CM

## 2020-10-08 DIAGNOSIS — C109 Malignant neoplasm of oropharynx, unspecified: Principal | ICD-10-CM

## 2020-10-08 DIAGNOSIS — K219 Gastro-esophageal reflux disease without esophagitis: Principal | ICD-10-CM

## 2020-10-08 DIAGNOSIS — G4700 Insomnia, unspecified: Principal | ICD-10-CM

## 2020-10-08 DIAGNOSIS — R64 Cachexia: Principal | ICD-10-CM

## 2020-10-08 DIAGNOSIS — C329 Malignant neoplasm of larynx, unspecified: Principal | ICD-10-CM

## 2020-10-08 DIAGNOSIS — Z923 Personal history of irradiation: Principal | ICD-10-CM

## 2020-10-08 MED ORDER — HYDROCODONE 5 MG-ACETAMINOPHEN 325 MG TABLET
ORAL_TABLET | Freq: Four times a day (QID) | ORAL | 0 refills | 3.00000 days | Status: CP | PRN
Start: 2020-10-08 — End: 2020-10-16

## 2020-10-09 DIAGNOSIS — Z09 Encounter for follow-up examination after completed treatment for conditions other than malignant neoplasm: Principal | ICD-10-CM

## 2020-10-16 ENCOUNTER — Ambulatory Visit
Admit: 2020-10-16 | Discharge: 2020-10-17 | Payer: MEDICARE | Attending: Student in an Organized Health Care Education/Training Program | Primary: Student in an Organized Health Care Education/Training Program

## 2020-10-16 ENCOUNTER — Ambulatory Visit: Admit: 2020-10-16 | Discharge: 2020-10-29 | Payer: MEDICARE

## 2020-10-16 DIAGNOSIS — C329 Malignant neoplasm of larynx, unspecified: Principal | ICD-10-CM

## 2020-10-16 DIAGNOSIS — Z85819 Personal history of malignant neoplasm of unspecified site of lip, oral cavity, and pharynx: Principal | ICD-10-CM

## 2020-10-16 DIAGNOSIS — F17200 Nicotine dependence, unspecified, uncomplicated: Principal | ICD-10-CM

## 2020-10-16 DIAGNOSIS — C109 Malignant neoplasm of oropharynx, unspecified: Principal | ICD-10-CM

## 2020-10-16 MED ORDER — OXYCODONE 10 MG TABLET
ORAL_TABLET | Freq: Three times a day (TID) | ORAL | 0 refills | 5.00000 days | Status: CP | PRN
Start: 2020-10-16 — End: 2020-10-19
  Filled 2020-10-16: qty 15, 5d supply, fill #0

## 2020-10-18 MED ORDER — BUPROPION HCL SR 150 MG TABLET,12 HR SUSTAINED-RELEASE
ORAL_TABLET | ORAL | 3 refills | 32 days | Status: CP
Start: 2020-10-18 — End: 2021-01-16

## 2020-10-18 MED ORDER — VARENICLINE 0.5 MG TABLET
ORAL_TABLET | Freq: Every day | ORAL | 2 refills | 30 days | Status: CP
Start: 2020-10-18 — End: 2021-01-16

## 2020-10-19 ENCOUNTER — Ambulatory Visit
Admit: 2020-10-19 | Discharge: 2020-10-20 | Payer: MEDICARE | Attending: Speech-Language Pathologist | Primary: Speech-Language Pathologist

## 2020-10-19 ENCOUNTER — Ambulatory Visit: Admit: 2020-10-19 | Discharge: 2020-10-20 | Payer: MEDICARE | Attending: Otolaryngology | Primary: Otolaryngology

## 2020-10-19 DIAGNOSIS — Z9221 Personal history of antineoplastic chemotherapy: Principal | ICD-10-CM

## 2020-10-19 DIAGNOSIS — Z923 Personal history of irradiation: Principal | ICD-10-CM

## 2020-10-19 DIAGNOSIS — C109 Malignant neoplasm of oropharynx, unspecified: Principal | ICD-10-CM

## 2020-10-19 DIAGNOSIS — R1310 Dysphagia, unspecified: Principal | ICD-10-CM

## 2020-10-19 DIAGNOSIS — Z931 Gastrostomy status: Principal | ICD-10-CM

## 2020-10-19 DIAGNOSIS — C139 Malignant neoplasm of hypopharynx, unspecified: Principal | ICD-10-CM

## 2020-10-19 DIAGNOSIS — R634 Abnormal weight loss: Principal | ICD-10-CM

## 2020-10-19 DIAGNOSIS — R9389 Abnormal findings on diagnostic imaging of other specified body structures: Principal | ICD-10-CM

## 2020-10-19 MED ORDER — OXYCODONE 10 MG TABLET
ORAL_TABLET | Freq: Three times a day (TID) | ORAL | 0 refills | 14 days | Status: CP | PRN
Start: 2020-10-19 — End: 2020-10-29

## 2020-10-22 ENCOUNTER — Ambulatory Visit: Admit: 2020-10-22 | Discharge: 2020-10-23 | Payer: MEDICARE

## 2020-10-26 DIAGNOSIS — Z01818 Encounter for other preprocedural examination: Principal | ICD-10-CM

## 2020-10-29 ENCOUNTER — Encounter
Admit: 2020-10-29 | Discharge: 2020-10-31 | Disposition: A | Discharge status: Home Health | Payer: MEDICARE | Attending: Student in an Organized Health Care Education/Training Program | Admitting: Otolaryngology

## 2020-10-29 ENCOUNTER — Ambulatory Visit
Admit: 2020-10-29 | Discharge: 2020-10-31 | Disposition: A | Discharge status: Home Health | Payer: MEDICARE | Admitting: Otolaryngology

## 2020-10-29 ENCOUNTER — Encounter
Admit: 2020-10-29 | Discharge: 2020-10-31 | Disposition: A | Discharge status: Home Health | Payer: MEDICARE | Attending: Hematology & Oncology | Admitting: Otolaryngology

## 2020-10-29 DIAGNOSIS — C109 Malignant neoplasm of oropharynx, unspecified: Principal | ICD-10-CM

## 2020-10-31 DIAGNOSIS — C139 Malignant neoplasm of hypopharynx, unspecified: Principal | ICD-10-CM

## 2020-10-31 MED ORDER — MIRTAZAPINE 15 MG TABLET
ORAL_TABLET | Freq: Every evening | ORAL | 0 refills | 30.00 days | Status: CP
Start: 2020-10-31 — End: 2020-11-30

## 2020-10-31 MED ORDER — PREDNISONE 10 MG TABLET
ORAL_TABLET | ORAL | 0 refills | 12.00 days | Status: CP
Start: 2020-10-31 — End: 2020-11-12

## 2020-10-31 MED ORDER — DICLOFENAC 1 % TOPICAL GEL
Freq: Four times a day (QID) | TOPICAL | 5 refills | 25.00000 days | Status: CP | PRN
Start: 2020-10-31 — End: 2021-10-31

## 2020-10-31 MED ORDER — AMOXICILLIN 875 MG-POTASSIUM CLAVULANATE 125 MG TABLET
ORAL_TABLET | Freq: Two times a day (BID) | GASTROSTOMY | 0 refills | 5.00 days | Status: CP
Start: 2020-10-31 — End: 2020-11-05

## 2020-10-31 MED ORDER — OXYCODONE 10 MG TABLET
ORAL_TABLET | GASTROSTOMY | 0 refills | 3.00000 days | Status: CP | PRN
Start: 2020-10-31 — End: 2020-11-05

## 2020-11-01 ENCOUNTER — Encounter: Admit: 2020-11-01 | Discharge: 2020-11-23 | Payer: MEDICARE

## 2020-11-01 ENCOUNTER — Inpatient Hospital Stay: Admit: 2020-11-01 | Discharge: 2020-11-23 | Payer: MEDICARE

## 2020-11-01 DIAGNOSIS — C109 Malignant neoplasm of oropharynx, unspecified: Principal | ICD-10-CM

## 2020-11-01 MED ORDER — LEVOTHYROXINE 150 MCG TABLET
ORAL_TABLET | Freq: Every day | GASTROSTOMY | 0 refills | 30.00 days | Status: CP
Start: 2020-11-01 — End: 2020-12-01

## 2020-11-02 ENCOUNTER — Encounter
Admit: 2020-11-02 | Discharge: 2020-11-06 | Disposition: A | Discharge status: Home Health | Payer: MEDICARE | Attending: Student in an Organized Health Care Education/Training Program | Admitting: Otolaryngology

## 2020-11-02 ENCOUNTER — Ambulatory Visit
Admit: 2020-11-02 | Discharge: 2020-11-06 | Disposition: A | Discharge status: Home Health | Payer: MEDICARE | Admitting: Otolaryngology

## 2020-11-05 MED ORDER — METHYLPREDNISOLONE 4 MG TABLETS IN A DOSE PACK
0 refills | 0 days | Status: CP
Start: 2020-11-05 — End: 2020-11-05

## 2020-11-06 MED ORDER — SENNOSIDES 8.6 MG TABLET
ORAL_TABLET | Freq: Every evening | GASTROSTOMY | 0 refills | 30.00 days | Status: CP
Start: 2020-11-06 — End: 2020-12-06

## 2020-11-06 MED ORDER — OXYCODONE 5 MG/5 ML ORAL SOLUTION
GASTROSTOMY | 0 refills | 2.00 days | Status: CP | PRN
Start: 2020-11-06 — End: 2020-11-11

## 2020-11-06 MED ORDER — DOCUSATE SODIUM 50 MG/5 ML ORAL LIQUID
Freq: Two times a day (BID) | GASTROSTOMY | 0 refills | 30.00 days | Status: CP
Start: 2020-11-06 — End: 2020-12-06

## 2020-11-07 DIAGNOSIS — R9389 Abnormal findings on diagnostic imaging of other specified body structures: Principal | ICD-10-CM

## 2020-11-07 DIAGNOSIS — C109 Malignant neoplasm of oropharynx, unspecified: Principal | ICD-10-CM

## 2020-11-07 MED ORDER — FENTANYL 25 MCG/HR TRANSDERMAL PATCH
MEDICATED_PATCH | TRANSDERMAL | 0 refills | 15.00 days | Status: CP
Start: 2020-11-07 — End: 2020-11-12

## 2020-11-08 DIAGNOSIS — C109 Malignant neoplasm of oropharynx, unspecified: Principal | ICD-10-CM

## 2020-11-09 ENCOUNTER — Ambulatory Visit: Admit: 2020-11-09 | Discharge: 2020-11-10 | Payer: MEDICARE | Attending: Registered" | Primary: Registered"

## 2020-11-09 ENCOUNTER — Ambulatory Visit: Admit: 2020-11-09 | Discharge: 2020-11-10 | Payer: MEDICARE | Attending: Otolaryngology | Primary: Otolaryngology

## 2020-11-09 ENCOUNTER — Ambulatory Visit
Admit: 2020-11-09 | Discharge: 2020-11-10 | Payer: MEDICARE | Attending: Speech-Language Pathologist | Primary: Speech-Language Pathologist

## 2020-11-09 DIAGNOSIS — R1310 Dysphagia, unspecified: Principal | ICD-10-CM

## 2020-11-09 DIAGNOSIS — C109 Malignant neoplasm of oropharynx, unspecified: Principal | ICD-10-CM

## 2020-11-09 DIAGNOSIS — R634 Abnormal weight loss: Principal | ICD-10-CM

## 2020-11-09 DIAGNOSIS — C139 Malignant neoplasm of hypopharynx, unspecified: Principal | ICD-10-CM

## 2020-11-09 DIAGNOSIS — Z930 Tracheostomy status: Principal | ICD-10-CM

## 2020-11-09 DIAGNOSIS — Z9221 Personal history of antineoplastic chemotherapy: Principal | ICD-10-CM

## 2020-11-09 DIAGNOSIS — Z923 Personal history of irradiation: Principal | ICD-10-CM

## 2020-11-09 MED ORDER — OXYCODONE 10 MG TABLET
ORAL_TABLET | GASTROSTOMY | 0 refills | 3 days | Status: CP | PRN
Start: 2020-11-09 — End: 2020-11-14

## 2020-11-12 ENCOUNTER — Ambulatory Visit: Admit: 2020-11-12 | Discharge: 2020-11-13 | Payer: MEDICARE

## 2020-11-12 ENCOUNTER — Ambulatory Visit
Admit: 2020-11-12 | Discharge: 2020-11-13 | Payer: MEDICARE | Attending: Hematology & Oncology | Primary: Hematology & Oncology

## 2020-11-12 ENCOUNTER — Institutional Professional Consult (permissible substitution): Admit: 2020-11-12 | Discharge: 2020-11-13 | Payer: MEDICARE | Attending: Otolaryngology | Primary: Otolaryngology

## 2020-11-12 DIAGNOSIS — R9389 Abnormal findings on diagnostic imaging of other specified body structures: Principal | ICD-10-CM

## 2020-11-12 DIAGNOSIS — C109 Malignant neoplasm of oropharynx, unspecified: Principal | ICD-10-CM

## 2020-11-12 DIAGNOSIS — C139 Malignant neoplasm of hypopharynx, unspecified: Principal | ICD-10-CM

## 2020-11-12 MED ORDER — ONDANSETRON HCL 8 MG TABLET
ORAL_TABLET | Freq: Three times a day (TID) | ORAL | 2 refills | 10.00000 days | Status: CP | PRN
Start: 2020-11-12 — End: 2021-11-12
  Filled 2020-11-12: qty 30, 10d supply, fill #0

## 2020-11-12 MED ORDER — PROCHLORPERAZINE MALEATE 10 MG TABLET
ORAL_TABLET | Freq: Four times a day (QID) | ORAL | 2 refills | 8.00000 days | Status: CP | PRN
Start: 2020-11-12 — End: 2021-11-12
  Filled 2020-11-12: qty 30, 8d supply, fill #0

## 2020-11-12 MED ORDER — OXYCODONE 10 MG TABLET
ORAL_TABLET | GASTROSTOMY | 0 refills | 20 days | Status: CP | PRN
Start: 2020-11-12 — End: 2020-11-17

## 2020-11-13 DIAGNOSIS — C109 Malignant neoplasm of oropharynx, unspecified: Principal | ICD-10-CM

## 2020-11-15 DIAGNOSIS — C109 Malignant neoplasm of oropharynx, unspecified: Principal | ICD-10-CM

## 2020-11-19 ENCOUNTER — Ambulatory Visit: Admit: 2020-11-19 | Payer: MEDICARE

## 2020-11-19 MED ORDER — VARENICLINE 0.5 MG TABLET
ORAL_TABLET | Freq: Every day | GASTROSTOMY | 2 refills | 30 days | Status: CP
Start: 2020-11-19 — End: 2021-02-17

## 2020-11-20 ENCOUNTER — Ambulatory Visit: Admit: 2020-11-20 | Discharge: 2020-11-21 | Payer: MEDICARE

## 2020-11-20 ENCOUNTER — Ambulatory Visit: Admit: 2020-11-20 | Discharge: 2020-11-21 | Payer: MEDICARE | Attending: Adult Health | Primary: Adult Health

## 2020-11-20 DIAGNOSIS — N189 Chronic kidney disease, unspecified: Principal | ICD-10-CM

## 2020-11-20 DIAGNOSIS — R9389 Abnormal findings on diagnostic imaging of other specified body structures: Principal | ICD-10-CM

## 2020-11-20 DIAGNOSIS — C109 Malignant neoplasm of oropharynx, unspecified: Principal | ICD-10-CM

## 2020-11-20 DIAGNOSIS — D631 Anemia in chronic kidney disease: Principal | ICD-10-CM

## 2020-11-21 DIAGNOSIS — C109 Malignant neoplasm of oropharynx, unspecified: Principal | ICD-10-CM

## 2020-11-23 DIAGNOSIS — C109 Malignant neoplasm of oropharynx, unspecified: Principal | ICD-10-CM

## 2020-11-26 DIAGNOSIS — C109 Malignant neoplasm of oropharynx, unspecified: Principal | ICD-10-CM

## 2020-11-28 DIAGNOSIS — C139 Malignant neoplasm of hypopharynx, unspecified: Principal | ICD-10-CM

## 2020-11-28 MED ORDER — OXYCODONE 10 MG TABLET
ORAL_TABLET | GASTROSTOMY | 0 refills | 5.00000 days | Status: CP | PRN
Start: 2020-11-28 — End: 2020-12-03

## 2020-11-29 DIAGNOSIS — C109 Malignant neoplasm of oropharynx, unspecified: Principal | ICD-10-CM

## 2020-12-03 DIAGNOSIS — R600 Localized edema: Principal | ICD-10-CM

## 2020-12-03 DIAGNOSIS — C109 Malignant neoplasm of oropharynx, unspecified: Principal | ICD-10-CM

## 2020-12-03 DIAGNOSIS — C139 Malignant neoplasm of hypopharynx, unspecified: Principal | ICD-10-CM

## 2020-12-03 DIAGNOSIS — R9389 Abnormal findings on diagnostic imaging of other specified body structures: Principal | ICD-10-CM

## 2020-12-03 MED ORDER — ONDANSETRON HCL 8 MG TABLET
ORAL_TABLET | Freq: Three times a day (TID) | ORAL | 2 refills | 10 days | Status: CP | PRN
Start: 2020-12-03 — End: 2021-12-03

## 2020-12-03 MED ORDER — PROCHLORPERAZINE MALEATE 10 MG TABLET
ORAL_TABLET | Freq: Four times a day (QID) | ORAL | 2 refills | 8 days | Status: CP | PRN
Start: 2020-12-03 — End: 2021-12-03

## 2020-12-03 MED ORDER — SENNOSIDES 8.8 MG/5 ML ORAL SYRUP
Freq: Two times a day (BID) | ORAL | 0 refills | 30 days | Status: CP
Start: 2020-12-03 — End: 2021-01-02

## 2020-12-03 MED ORDER — POLYETHYLENE GLYCOL 3350 17 GRAM/DOSE ORAL POWDER
0 refills | 0 days | Status: CP
Start: 2020-12-03 — End: ?

## 2020-12-03 MED ORDER — OXYCODONE 10 MG TABLET
ORAL_TABLET | GASTROSTOMY | 0 refills | 7 days | Status: CP | PRN
Start: 2020-12-03 — End: 2020-12-10

## 2020-12-05 ENCOUNTER — Ambulatory Visit
Admit: 2020-12-05 | Discharge: 2020-12-13 | Disposition: A | Discharge status: Home Health | Payer: MEDICARE | Admitting: Student in an Organized Health Care Education/Training Program

## 2020-12-05 ENCOUNTER — Ambulatory Visit
Admit: 2020-12-05 | Discharge: 2020-12-13 | Disposition: A | Discharge status: Home Health | Payer: MEDICARE | Attending: Adult Health | Admitting: Student in an Organized Health Care Education/Training Program | Primary: Adult Health

## 2020-12-05 ENCOUNTER — Other Ambulatory Visit
Admit: 2020-12-05 | Discharge: 2020-12-13 | Disposition: A | Discharge status: Home Health | Payer: MEDICARE | Admitting: Student in an Organized Health Care Education/Training Program

## 2020-12-13 MED ORDER — SENNOSIDES 8.8 MG/5 ML ORAL SYRUP
Freq: Two times a day (BID) | GASTROENTERAL | 1 refills | 30.00000 days | Status: CP
Start: 2020-12-13 — End: 2021-01-12

## 2020-12-13 MED ORDER — DULOXETINE 30 MG CAPSULE,DELAYED RELEASE SPRINKLE
ORAL_CAPSULE | Freq: Every day | GASTROSTOMY | 1 refills | 0.00000 days | Status: CP
Start: 2020-12-13 — End: 2021-01-12

## 2020-12-13 MED ORDER — MIRTAZAPINE 15 MG TABLET
ORAL_TABLET | Freq: Every evening | GASTROSTOMY | 3 refills | 90.00000 days | Status: CP
Start: 2020-12-13 — End: 2021-01-12

## 2020-12-13 MED ORDER — LEVOTHYROXINE 150 MCG TABLET
ORAL_TABLET | Freq: Every day | GASTROSTOMY | 0 refills | 30.00000 days | Status: CP
Start: 2020-12-13 — End: 2021-01-12

## 2020-12-13 MED ORDER — PROCHLORPERAZINE MALEATE 10 MG TABLET
ORAL_TABLET | Freq: Four times a day (QID) | GASTROSTOMY | 1 refills | 8.00000 days | Status: CP | PRN
Start: 2020-12-13 — End: ?

## 2020-12-13 MED ORDER — BUPROPION HCL 100 MG TABLET
ORAL_TABLET | Freq: Two times a day (BID) | GASTROSTOMY | 1 refills | 30.00000 days | Status: CP
Start: 2020-12-13 — End: 2021-01-12

## 2020-12-13 MED ORDER — OXYCODONE 5 MG/5 ML ORAL SOLUTION
GASTROSTOMY | 0 refills | 4.00000 days | Status: CP | PRN
Start: 2020-12-13 — End: 2020-12-18

## 2020-12-13 MED ORDER — ONDANSETRON HCL 8 MG TABLET
ORAL_TABLET | Freq: Three times a day (TID) | GASTROSTOMY | 2 refills | 10.00000 days | Status: CP | PRN
Start: 2020-12-13 — End: 2021-01-12

## 2020-12-13 MED ORDER — POLYETHYLENE GLYCOL 3350 17 GRAM ORAL POWDER PACKET
PACK | Freq: Three times a day (TID) | GASTROSTOMY | 1 refills | 30.00000 days | Status: CP
Start: 2020-12-13 — End: 2021-01-12

## 2020-12-13 MED ORDER — GABAPENTIN 300 MG CAPSULE
ORAL_CAPSULE | Freq: Three times a day (TID) | GASTROSTOMY | 1 refills | 30.00000 days | Status: CP
Start: 2020-12-13 — End: 2021-01-12

## 2020-12-17 ENCOUNTER — Encounter: Admit: 2020-12-17 | Discharge: 2021-01-15 | Payer: MEDICARE

## 2020-12-17 ENCOUNTER — Inpatient Hospital Stay: Admit: 2020-12-17 | Discharge: 2021-01-15 | Payer: MEDICARE

## 2020-12-19 DIAGNOSIS — C109 Malignant neoplasm of oropharynx, unspecified: Principal | ICD-10-CM

## 2020-12-19 DIAGNOSIS — J0410 Acute tracheitis without obstruction: Principal | ICD-10-CM

## 2020-12-19 DIAGNOSIS — Z85819 Personal history of malignant neoplasm of unspecified site of lip, oral cavity, and pharynx: Principal | ICD-10-CM

## 2020-12-19 MED ORDER — OXYCODONE 10 MG TABLET
ORAL_TABLET | ORAL | 0 refills | 2.00000 days | Status: CP | PRN
Start: 2020-12-19 — End: 2020-12-19
  Filled 2020-12-19: qty 10, 2d supply, fill #0

## 2020-12-21 DIAGNOSIS — C109 Malignant neoplasm of oropharynx, unspecified: Principal | ICD-10-CM

## 2020-12-21 DIAGNOSIS — J0410 Acute tracheitis without obstruction: Principal | ICD-10-CM

## 2020-12-21 DIAGNOSIS — Z85819 Personal history of malignant neoplasm of unspecified site of lip, oral cavity, and pharynx: Principal | ICD-10-CM

## 2020-12-21 MED FILL — OXYCODONE 10 MG TABLET: ORAL | 7 days supply | Qty: 40 | Fill #0

## 2020-12-25 ENCOUNTER — Ambulatory Visit: Admit: 2020-12-25 | Discharge: 2020-12-26 | Payer: MEDICARE

## 2020-12-25 ENCOUNTER — Other Ambulatory Visit: Admit: 2020-12-25 | Discharge: 2020-12-26 | Payer: MEDICARE

## 2020-12-25 ENCOUNTER — Ambulatory Visit
Admit: 2020-12-25 | Discharge: 2020-12-26 | Payer: MEDICARE | Attending: Hematology & Oncology | Primary: Hematology & Oncology

## 2020-12-25 DIAGNOSIS — F1190 Opioid use, unspecified, uncomplicated: Principal | ICD-10-CM

## 2020-12-25 DIAGNOSIS — C139 Malignant neoplasm of hypopharynx, unspecified: Principal | ICD-10-CM

## 2020-12-25 DIAGNOSIS — C109 Malignant neoplasm of oropharynx, unspecified: Principal | ICD-10-CM

## 2020-12-25 DIAGNOSIS — G893 Neoplasm related pain (acute) (chronic): Principal | ICD-10-CM

## 2020-12-25 DIAGNOSIS — F1111 Opioid abuse, in remission: Principal | ICD-10-CM

## 2020-12-25 DIAGNOSIS — R9389 Abnormal findings on diagnostic imaging of other specified body structures: Principal | ICD-10-CM

## 2020-12-25 DIAGNOSIS — Z79899 Other long term (current) drug therapy: Principal | ICD-10-CM

## 2020-12-25 DIAGNOSIS — Z0289 Encounter for other administrative examinations: Principal | ICD-10-CM

## 2020-12-25 DIAGNOSIS — Z515 Encounter for palliative care: Principal | ICD-10-CM

## 2020-12-25 DIAGNOSIS — T402X5A Adverse effect of other opioids, initial encounter: Principal | ICD-10-CM

## 2020-12-25 DIAGNOSIS — K5903 Drug induced constipation: Principal | ICD-10-CM

## 2020-12-25 MED ORDER — XTAMPZA ER 9 MG CAPSULE SPRINKLE
Freq: Two times a day (BID) | GASTROSTOMY | 0 refills | 21.00000 days | Status: CP
Start: 2020-12-25 — End: 2021-01-15

## 2020-12-25 MED ORDER — POLYETHYLENE GLYCOL 3350 17 GRAM/DOSE ORAL POWDER
Freq: Two times a day (BID) | GASTROSTOMY | 3 refills | 25 days | Status: CP
Start: 2020-12-25 — End: 2020-12-25

## 2020-12-25 MED ORDER — OXYCODONE 10 MG TABLET
ORAL_TABLET | 0 refills | 0 days | Status: CP
Start: 2020-12-25 — End: ?

## 2020-12-25 MED ORDER — SENNOSIDES 8.8 MG/5 ML ORAL SYRUP
Freq: Two times a day (BID) | GASTROENTERAL | 1 refills | 24 days | Status: CP
Start: 2020-12-25 — End: ?

## 2020-12-26 MED ORDER — SENNOSIDES 8.6 MG TABLET
ORAL_TABLET | Freq: Two times a day (BID) | ORAL | 11 refills | 30 days | Status: CP
Start: 2020-12-26 — End: 2021-12-26

## 2020-12-27 ENCOUNTER — Ambulatory Visit: Admit: 2020-12-27 | Discharge: 2020-12-28 | Payer: MEDICARE

## 2020-12-27 DIAGNOSIS — Z85819 Personal history of malignant neoplasm of unspecified site of lip, oral cavity, and pharynx: Principal | ICD-10-CM

## 2020-12-27 DIAGNOSIS — C109 Malignant neoplasm of oropharynx, unspecified: Principal | ICD-10-CM

## 2020-12-27 DIAGNOSIS — J0410 Acute tracheitis without obstruction: Principal | ICD-10-CM

## 2020-12-31 ENCOUNTER — Ambulatory Visit
Admit: 2020-12-31 | Discharge: 2021-01-01 | Payer: MEDICARE | Attending: Speech-Language Pathologist | Primary: Speech-Language Pathologist

## 2020-12-31 ENCOUNTER — Ambulatory Visit: Admit: 2020-12-31 | Discharge: 2021-01-01 | Payer: MEDICARE

## 2020-12-31 DIAGNOSIS — C109 Malignant neoplasm of oropharynx, unspecified: Principal | ICD-10-CM

## 2020-12-31 DIAGNOSIS — Z85819 Personal history of malignant neoplasm of unspecified site of lip, oral cavity, and pharynx: Principal | ICD-10-CM

## 2020-12-31 DIAGNOSIS — C139 Malignant neoplasm of hypopharynx, unspecified: Principal | ICD-10-CM

## 2020-12-31 DIAGNOSIS — J0410 Acute tracheitis without obstruction: Principal | ICD-10-CM

## 2020-12-31 DIAGNOSIS — F419 Anxiety disorder, unspecified: Principal | ICD-10-CM

## 2020-12-31 DIAGNOSIS — C760 Malignant neoplasm of head, face and neck: Principal | ICD-10-CM

## 2020-12-31 MED ORDER — ALPRAZOLAM 0.25 MG TABLET
ORAL_TABLET | Freq: Three times a day (TID) | ORAL | 0 refills | 10 days | Status: CP | PRN
Start: 2020-12-31 — End: 2021-12-31

## 2021-01-01 DIAGNOSIS — Z85819 Personal history of malignant neoplasm of unspecified site of lip, oral cavity, and pharynx: Principal | ICD-10-CM

## 2021-01-01 DIAGNOSIS — J0410 Acute tracheitis without obstruction: Principal | ICD-10-CM

## 2021-01-01 DIAGNOSIS — C109 Malignant neoplasm of oropharynx, unspecified: Principal | ICD-10-CM

## 2021-01-02 DIAGNOSIS — J0410 Acute tracheitis without obstruction: Principal | ICD-10-CM

## 2021-01-02 DIAGNOSIS — Z85819 Personal history of malignant neoplasm of unspecified site of lip, oral cavity, and pharynx: Principal | ICD-10-CM

## 2021-01-02 DIAGNOSIS — C109 Malignant neoplasm of oropharynx, unspecified: Principal | ICD-10-CM

## 2021-01-02 DIAGNOSIS — G893 Neoplasm related pain (acute) (chronic): Principal | ICD-10-CM

## 2021-01-02 DIAGNOSIS — C139 Malignant neoplasm of hypopharynx, unspecified: Principal | ICD-10-CM

## 2021-01-02 MED ORDER — OXYCODONE 10 MG TABLET
ORAL_TABLET | 0 refills | 0 days | Status: CP
Start: 2021-01-02 — End: ?

## 2021-01-09 DIAGNOSIS — C109 Malignant neoplasm of oropharynx, unspecified: Principal | ICD-10-CM

## 2021-01-09 DIAGNOSIS — J0410 Acute tracheitis without obstruction: Principal | ICD-10-CM

## 2021-01-09 DIAGNOSIS — Z85819 Personal history of malignant neoplasm of unspecified site of lip, oral cavity, and pharynx: Principal | ICD-10-CM

## 2021-01-14 NOTE — Unmapped (Signed)
Not seen by me in clinic this day; see progress note from Burtis Junes NP.

## 2021-01-15 ENCOUNTER — Ambulatory Visit: Admit: 2021-01-15 | Discharge: 2021-01-16 | Payer: MEDICARE | Attending: Clinical | Primary: Clinical

## 2021-01-15 ENCOUNTER — Ambulatory Visit: Admit: 2021-01-15 | Discharge: 2021-01-29 | Payer: MEDICARE

## 2021-01-15 ENCOUNTER — Ambulatory Visit
Admit: 2021-01-15 | Discharge: 2021-01-29 | Payer: MEDICARE | Attending: Hematology & Oncology | Primary: Hematology & Oncology

## 2021-01-15 MED ORDER — OXYCODONE 10 MG TABLET
ORAL_TABLET | 0 refills | 0 days | Status: CP
Start: 2021-01-15 — End: ?

## 2021-01-15 MED ORDER — XTAMPZA ER 18 MG CAPSULE SPRINKLE
Freq: Two times a day (BID) | ORAL | 0 refills | 30 days | Status: CP
Start: 2021-01-15 — End: 2021-02-14

## 2021-01-16 ENCOUNTER — Encounter: Admit: 2021-01-16 | Discharge: 2021-01-17 | Payer: MEDICARE

## 2021-01-17 MED ORDER — NALOXONE 4 MG/ACTUATION NASAL SPRAY
0 refills | 0 days | Status: CP
Start: 2021-01-17 — End: ?

## 2021-01-18 DIAGNOSIS — F32A Depression, unspecified depression type: Principal | ICD-10-CM

## 2021-01-18 DIAGNOSIS — F321 Major depressive disorder, single episode, moderate: Principal | ICD-10-CM

## 2021-01-18 DIAGNOSIS — R4189 Other symptoms and signs involving cognitive functions and awareness: Principal | ICD-10-CM

## 2021-01-18 DIAGNOSIS — R29818 Other symptoms and signs involving the nervous system: Principal | ICD-10-CM

## 2021-01-18 DIAGNOSIS — C109 Malignant neoplasm of oropharynx, unspecified: Principal | ICD-10-CM

## 2021-01-18 DIAGNOSIS — F419 Anxiety disorder, unspecified: Principal | ICD-10-CM

## 2021-01-18 DIAGNOSIS — Z515 Encounter for palliative care: Principal | ICD-10-CM

## 2021-01-21 ENCOUNTER — Telehealth: Payer: Self-pay | Admitting: Nurse Practitioner

## 2021-01-21 DIAGNOSIS — C109 Malignant neoplasm of oropharynx, unspecified: Principal | ICD-10-CM

## 2021-01-21 DIAGNOSIS — T451X5A Adverse effect of antineoplastic and immunosuppressive drugs, initial encounter: Principal | ICD-10-CM

## 2021-01-21 DIAGNOSIS — D701 Agranulocytosis secondary to cancer chemotherapy: Principal | ICD-10-CM

## 2021-01-21 MED ORDER — UDENYCA 6 MG/0.6 ML SUBCUTANEOUS SYRINGE
SUBCUTANEOUS | 5 refills | 21.00000 days | Status: CP
Start: 2021-01-21 — End: ?

## 2021-01-21 NOTE — Telephone Encounter (Signed)
Spoke with patient regarding Palliative referral/services and he was in agreement with starting services.  Scheduled an In-home Consult for 01/30/21 @ 9 AM.

## 2021-01-22 DIAGNOSIS — T451X5A Adverse effect of antineoplastic and immunosuppressive drugs, initial encounter: Principal | ICD-10-CM

## 2021-01-22 DIAGNOSIS — C109 Malignant neoplasm of oropharynx, unspecified: Principal | ICD-10-CM

## 2021-01-22 DIAGNOSIS — D701 Agranulocytosis secondary to cancer chemotherapy: Principal | ICD-10-CM

## 2021-01-23 ENCOUNTER — Ambulatory Visit: Admit: 2021-01-23 | Discharge: 2021-01-24 | Payer: MEDICARE

## 2021-01-23 ENCOUNTER — Other Ambulatory Visit: Admit: 2021-01-23 | Discharge: 2021-01-24 | Payer: MEDICARE

## 2021-01-23 DIAGNOSIS — C109 Malignant neoplasm of oropharynx, unspecified: Principal | ICD-10-CM

## 2021-01-23 DIAGNOSIS — F32A Depression, unspecified depression type: Principal | ICD-10-CM

## 2021-01-23 DIAGNOSIS — K5900 Constipation, unspecified: Principal | ICD-10-CM

## 2021-01-23 DIAGNOSIS — R9389 Abnormal findings on diagnostic imaging of other specified body structures: Principal | ICD-10-CM

## 2021-01-23 MED ORDER — DULOXETINE 30 MG CAPSULE,DELAYED RELEASE SPRINKLE
ORAL_CAPSULE | Freq: Every day | GASTROSTOMY | 1 refills | 0.00000 days | Status: CP
Start: 2021-01-23 — End: 2021-01-23

## 2021-01-23 MED ORDER — ONDANSETRON HCL 8 MG TABLET
ORAL_TABLET | Freq: Three times a day (TID) | GASTROSTOMY | 2 refills | 10.00000 days | Status: CP | PRN
Start: 2021-01-23 — End: 2021-02-22

## 2021-01-23 MED ORDER — POLYETHYLENE GLYCOL 3350 17 GRAM ORAL POWDER PACKET
PACK | Freq: Every day | GASTROSTOMY | 1 refills | 90.00000 days | Status: CP | PRN
Start: 2021-01-23 — End: ?

## 2021-01-24 MED ORDER — ONDANSETRON 8 MG DISINTEGRATING TABLET
ORAL_TABLET | Freq: Three times a day (TID) | ORAL | 1 refills | 10 days | Status: CP | PRN
Start: 2021-01-24 — End: ?

## 2021-01-24 MED ORDER — SERTRALINE 50 MG TABLET
ORAL_TABLET | Freq: Every day | GASTROSTOMY | 0 refills | 30 days | Status: CP
Start: 2021-01-24 — End: 2021-02-23

## 2021-01-25 DIAGNOSIS — T451X5A Adverse effect of antineoplastic and immunosuppressive drugs, initial encounter: Principal | ICD-10-CM

## 2021-01-25 DIAGNOSIS — D701 Agranulocytosis secondary to cancer chemotherapy: Principal | ICD-10-CM

## 2021-01-25 MED ORDER — UDENYCA 6 MG/0.6 ML SUBCUTANEOUS SYRINGE
SUBCUTANEOUS | 5 refills | 21 days | Status: CP
Start: 2021-01-25 — End: ?

## 2021-01-28 ENCOUNTER — Ambulatory Visit: Admit: 2021-01-28 | Discharge: 2021-01-28 | Disposition: A | Discharge status: Home / Self Care | Payer: MEDICARE

## 2021-01-28 DIAGNOSIS — T85528A Displacement of other gastrointestinal prosthetic devices, implants and grafts, initial encounter: Principal | ICD-10-CM

## 2021-01-30 ENCOUNTER — Encounter: Payer: Self-pay | Admitting: Nurse Practitioner

## 2021-01-30 ENCOUNTER — Other Ambulatory Visit: Payer: Medicare (Managed Care) | Admitting: Nurse Practitioner

## 2021-01-30 ENCOUNTER — Other Ambulatory Visit: Payer: Self-pay

## 2021-01-30 DIAGNOSIS — Z515 Encounter for palliative care: Secondary | ICD-10-CM

## 2021-01-30 DIAGNOSIS — T402X5A Adverse effect of other opioids, initial encounter: Secondary | ICD-10-CM

## 2021-01-30 DIAGNOSIS — K5903 Drug induced constipation: Secondary | ICD-10-CM

## 2021-01-30 NOTE — Progress Notes (Signed)
[facility] Palliative Care Consult Note Telephone: [phone]  Fax: [phone]    Date of encounter: 01/30/21 PATIENT NAME: Brandon Lucero [address] [place] [address]   [phone] (home)  DOB: 21-Jun-1956 MRN: [id number] PRIMARY CARE PROVIDER:   Yetta Flock Lucero RESPONSIBLE PARTY:    Contact Information    Name Relation Home Work [place]  [phone]       I met face to face with patient home. Palliative Care was asked to follow this patient by consultation request of  Brandon Flock, FNP to address advance care planning and complex medical decision making. This is the initial visit.   ASSESSMENT AND PLAN / RECOMMENDATIONS:   Advance Care Planning/Goals of Care: Goals include to maximize quality of life and symptom management. Our advance care planning conversation included a discussion about:     The value and importance of advance care planning   Experiences with loved ones who have been seriously ill or have died   Exploration of personal, cultural or spiritual beliefs that might influence medical decisions   Exploration of goals of care in the event of a sudden injury or illness   Identification and preparation of a healthcare agent   Review and updating or creation of an  advance directive document .  Decision not to resuscitate or to de-escalate disease focused treatments due to poor prognosis.  CODE STATUS: DNR/DNI  Symptom Management/Plan: 1. ACP: DNR; Per [facility] notes will complete at next visit 02/06/2021 01/24/2021 HCDM Brandon Lucero [phone] HCDM (Brandon Lucero) Brandon Lucero mother [phone]  2. Medication compliance concern for safe medication administration as when reviewed med lists he was going by multiple lists as well as medication box filled with d/c medications. Also wrong frequency such as Brandon Lucero put levothyroxine 4 tablets a day; gabapentin 4 tablets a day;  zoloft 3 times a day;he reorganized medications according to newest list compared with last Brandon Lucero visit. Would benefit from bubble packing his medications. Or have Home health nursing fill medication box weekly for safety, compliance. Will need closer monitoring as it appears Brandon Lucero ability to organize his medications are very difficult for him. Brandon Lucero endorses he does not put his Brandon Lucero in the medication boxes. Brandon Lucero endorses he keeps his Brandon Lucero, Brandon Lucero in a locked place stating that he had to hide his medication but declined to elaborate. Will also see if can enroll in RN/SW PC monthly visits with Brandon Lucero in addition to Luc[facility] visits in collaboration to [facility]   3. Constipation opiate induced, discussed senna at length, placed in medication box per schedule. Talked about bowel regimen.   4. Goals of Care: Goals include to maximize quality of life and symptom management. Our advance care planning conversation included a discussion about:     The value and importance of advance care planning   Exploration of personal, cultural or spiritual beliefs that might influence medical decisions   Exploration of goals of care in the event of a sudden injury or illness   Identification and preparation of a healthcare agent   Review and updating or creation of an advance directive document.  5. Palliative care encounter; Palliative care encounter; Palliative medicine team will continue to support patient, patient's family, and medical team. Visit consisted of counseling and education dealing with the complex and emotionally intense issues of symptom management and palliative care in the  setting of serious and potentially life-threatening illness  Follow up Palliative Care Visit: Palliative care will continue to follow for complex medical decision making, advance care planning, and clarification of goals. Return 4 weeks or prn.  I spent 75 minutes  providing this consultation. More than 50% of the time in this consultation was spent in counseling and care coordination.  PPS: 50%  HOSPICE ELIGIBILITY/DIAGNOSIS: TBD  Chief Complaint: Initial Palliative consult for complex medical decision making  HISTORY OF PRESENT ILLNESS:  Brandon Lucero is a 65 y.o. 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 Limited notes received for review period noted 01/17/2021 Dr Brandon Lucero options offered supportive care to restart with prior chemo discussion intensities of chemotherapy. Discuss neck fistula and chemotherapy will not improve this. Hypopharynx spot grew with a little bit of chemotherapy that he has had been able to get. It does get chemo recommended: 5-FU with pump, carbo and pembro. Probability of years of control is less than five to 10% more difficult regiment to tolerate. Not for curative intent quality of life can occur with this chemo regimen more concern that the chemo can be harmful. Confirmed DNR/DNI. Noted he does have in home care two days a week for an hour. Brandon Lucero brother Brandon Lucero and sister-in-law Brandon Lucero live about 30 to 40 minutes away and have their own health issues. Per notes anxiety/depression concerned about overall prognosis and uncertainty some interpersonal issues with his son. [facility] visit Also sorted and organized medications. Zoloft and restarting remeron to help with mood and continue Brandon Lucero as needed. Rexilti not restarted and cymbalta did not feel comfortable due to concerns with malabsorption. [facility] requested Dr Toribio Harbour to see when Brandon Lucero is at infusion visit to see rather than phone. Confirmed in person palliative care follow up visit with covid screening which was negative. Mr Lucero in agreement to having palliative care student also attend visit. Christelle [place] Lucero Student arrived at Brandon Lucero home. Mr.  Lucero was standing outside and we proceeded to walk in and sit on the couch. We talked about purpose of palliative care visit. Mr Lucero was in agreement. We talked about how Brandon Lucero is feeling today. Mr. Thune endorses he is sleeping a lot, tired. We talked about past medical history including cancer, radiation and chemotherapy treatments. We talked about upcoming treatments with last oncology visit discussion. We talked about G-tube placement. Brandon Lucero typically tries to give himself five to six cans a day effectively. We talked about no residual and he usually is able to put two cans in at a sitting. We talked about symptoms of pain and pain regimen which is currently being managed by [facility] palliative and will continue. We talked about symptoms of nausea with antiemetic. We reviewed medications. We talked about medical goals of care. We talked about medication review. Brandon Lucero filled up his medication box. The medications were incorrect with many errors. Mr Lucero had levothyroxin four times a day as well as multiple antidepressants in the box is multiple times a day. Discussed and redid medbox per newest medication list. We talked about possibility of bubble packing or having home health nursing fill med box weekly. Will f/u with [facility] for safety of medication and risk of polypharmacy and taking too much of the same medication not as prescribed. We thoroughly went through his medications with Brandon Lucero.  Stressed the importance of safety with medications. We talked about symptoms of depression. We talked about medications that seem to be helping him feel better period we talked about challenges with his health and ongoing treatments.  We talked about having a life limiting disease process with progression. We talked about grieving, coping strategies. We talked about psychotherapy. Mr Brightbill shared that he does have a counselor assigned to him but has not been having any recent visits.  We talked about life review as he  does have a son and daughter and two grandchildren. Mr. Uher is divorced.If we talked about adls. We talked about further resources that could be available for assistance. We talked about palliative care social worker with our RN program which may beneficial with close monitoring if available. We talked about role of home palliative care and plan of care. We talked about follow up palliative care visit in six weeks. Will touch base with [facility] for update on PC visit; recommendation; concerns for medications, concerns for caring for himself as it appears it is more difficult to care for himself at home. Will also send request for RN/SW to look at case for monthly visits.   Medication list Brandon Lucero 0.25 mg QAM PRN for anxiety Gabapentin 300 mg by G tube tid Levothyroxine 132mcg by G tube daily Remeron 15 mg by G tube qhs Narcan $RemoveBe'4MG'fRCUopzJi$  nasal spray one spray either nostril for suspected opiate overdose Brandon Lucero 8 milligrams Q 8 hours as needed for nausea Brandon Lucero 10 mg IR Q 3 hours under tongue as needed for pain with maximum seven tablets a day Brandon Lucero $RemoveBe'18mg'yVdLMMDIl$  q12hrs Udenyca $RemoveBefore'6mg'ANVOFSJkLuqKH$ /0.38ml q21days SQ Miralax 17 grams by G tube QAM PRN Brandon Lucero 10 mg by G tube Q 6 hours as needed for nausea Senna 8.6 mg 1 tablet bid Sertraline $RemoveBefore'50mg'IibIBzjIotXYH$  by G tube qam History obtained from review of EMR, discussion with Mr. Trickett.  I reviewed available labs, medications, imaging, studies and related documents from the EMR.  Records reviewed and summarized above.   ROS Full 14 system review of systems performed and negative with exception of: as per HPI.  Physical Exam: Constitutional: NAD General: frail appearing, thin, pleasant male ENMT:  oral mucous membranes moist CV: S1S2, RRR, no LE edema Pulmonary: decreased bases, no increased work of breathing, room air Abdomen: soft and non tender; g-tube MSK: ambulatory Skin: warm and dry, no rashes or wounds on visible skin Neuro:  + generalized weakness,  no cognitive  impairment Psych: flat affect, A and O x 3  PAST MEDICAL HISTORY:  Active Ambulatory Problems    Diagnosis Date Noted  . No Active Ambulatory Problems   Resolved Ambulatory Problems    Diagnosis Date Noted  . No Resolved Ambulatory Problems   Past Medical History:  Diagnosis Date  . Cancer [facility])    SOCIAL HX:  Social History   Tobacco Use  . Smoking status: Current Some Day Smoker  . Smokeless tobacco: Never Used  Substance Use Topics  . Alcohol use: Not on file   FAMILY HX: Reviewed;  ALLERGIES: No Known Allergies   PERTINENT MEDICATIONS:  No outpatient encounter medications on file as of 01/30/2021.   No facility-administered encounter medications on file as of 01/30/2021.   Questions and concerns were addressed. The patient was encouraged to call with questions and/or concerns. My business card was provided. Provided general support and encouragement, no other unmet needs identified  Thank you for the opportunity to participate in the care of  Mr. Dibello.  The palliative care team will continue to follow. Please call our office at [phone] if we can be of additional assistance.   This chart was dictated using voice recognition software. Despite best efforts to proofread, errors can occur which can change the documentation meaning.  Loriene Taunton Z Maclin Guerrette, Lucero , MSN, ACHPN  COVID-19 PATIENT SCREENING TOOL Asked and negative response unless otherwise noted:   Have you had symptoms of covid, tested positive or been in contact with someone with symptoms/positive test in the past 5-10 days? NO

## 2021-02-02 ENCOUNTER — Ambulatory Visit: Admit: 2021-02-02 | Discharge: 2021-02-03 | Disposition: A | Discharge status: Home / Self Care | Payer: MEDICARE

## 2021-02-02 DIAGNOSIS — K9423 Gastrostomy malfunction: Principal | ICD-10-CM

## 2021-02-05 ENCOUNTER — Ambulatory Visit: Admit: 2021-02-05 | Discharge: 2021-02-06 | Payer: MEDICARE | Attending: Clinical | Primary: Clinical

## 2021-02-05 ENCOUNTER — Ambulatory Visit: Admit: 2021-02-05 | Discharge: 2021-02-05 | Payer: MEDICARE | Attending: Adult Health | Primary: Adult Health

## 2021-02-05 ENCOUNTER — Ambulatory Visit: Admit: 2021-02-05 | Discharge: 2021-02-05 | Payer: MEDICARE

## 2021-02-05 ENCOUNTER — Ambulatory Visit: Admit: 2021-02-05 | Discharge: 2021-02-05 | Payer: MEDICARE | Attending: Oncology | Primary: Oncology

## 2021-02-05 DIAGNOSIS — T402X5A Adverse effect of other opioids, initial encounter: Principal | ICD-10-CM

## 2021-02-05 DIAGNOSIS — C109 Malignant neoplasm of oropharynx, unspecified: Principal | ICD-10-CM

## 2021-02-05 DIAGNOSIS — G893 Neoplasm related pain (acute) (chronic): Principal | ICD-10-CM

## 2021-02-05 DIAGNOSIS — F32A Depression, unspecified depression type: Principal | ICD-10-CM

## 2021-02-05 DIAGNOSIS — K5903 Drug induced constipation: Principal | ICD-10-CM

## 2021-02-05 MED ORDER — SENNOSIDES 8.6 MG TABLET
ORAL_TABLET | Freq: Two times a day (BID) | GASTROSTOMY | 2 refills | 30 days | Status: CP
Start: 2021-02-05 — End: ?

## 2021-02-05 MED ORDER — POLYETHYLENE GLYCOL 3350 17 GRAM/DOSE ORAL POWDER
Freq: Two times a day (BID) | GASTROSTOMY | 3 refills | 25.00000 days | Status: CP | PRN
Start: 2021-02-05 — End: 2021-02-05

## 2021-02-05 NOTE — Unmapped (Signed)
OUTPATIENT ONCOLOGY PALLIATIVE CARE    Principal supportive care diagnosis: Walter Armstrong is a 65 y.o. 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    Assessment/Plan:   1. Pain-anterior and posterior neck due to SCC recurrence and fracture in C7-controlled well with current regimen. Seen by Rad onc-Not a candidate for radiation due to potential side effects.  -Continue oxycodone 10 mg every 3 hours with a max of 7 tabs/day-okay to continue taking sublingually.  Shared that this is the only medication that he can take orally and it has to be underneath his tongue.  -Continue  xtampza 18 mg twice daily. Confirmed that is taking  this via g-tube  -Next refill due on 6/14       2.  Potential for opioid-induced constipation-last BM was 3 days ago.   -Restart senokot bid  -Increase miralax bid    3. Wgt loss-down 4 lbs since last visit-wgt is 140 lbs.  -Consult for [facility] dietitian sent.     4. Depression/Anxiety-continues, but overall more relaxed at today's visit.  NP concern over med compliance despite numerous visits to use pillbox and review of medication regimen.  Community-based palliative care nurse practitioner had the same concern will her visit in May.  Did have home health support in past, but they are currently not active.  Trying to get home health back out in the home to help with med compliance which remains a concern.  Provide after visit summary with current medication regimen.  No longer has any scripts for benzos.    -Continue mirtazapine 15 mg at at bedtime.  -Do not feel comfortable prescribing Cymbalta due to concerns that absorption-may not be good through the G-tube.  Walter Armstrong is administering his own medications.  -Continue Zoloft 50 mg via G-tube  -Will not restart rexulti.  Plan is to see Dr. Willaim Bane.  -Active with Dr. Electa Sniff   -Will not prescribe benzos for now.  -Will bring pillbox and medications in with next visit    4. Support-Shared concern if Walter Armstrong will have adequate support if his chemotherapy produces increasing fatigue. Currently, independent with personal care.  His strengths include his spiritual devotion-has an active prayer life.  His brother Walter Armstrong and Walter Armstrong sister-in-law are highly supportive and they have health issues of their own.    5.  Goals of care/advance care planning  -Goal is to live as long as he possibly can and also not to experience much suffering and would like his quality of life to be high.  The prepare for your care document is completed and in epic.   -Shared that his fear is that he will be alone as he approaches end-of-life care.  -NP began a discussion of hospice care and their services.  -Met jointly with Walter Reno, RN and Walter Armstrong, CPP to review upcoming chemotherapy if he decides to receive it.  For now, would like to receive the chemotherapy.    -Visit on 01/17/21-Dr. Janann August offering options of care. Believes he has a different cancer than the 1st. Options are supportive care, to restart with prior chemo-discussed intensities of chemo.   -Discussed neck fistula and chemo will not improve this.   -Hypopharynx spot grew with a little bit of chemo that he had been able to get.   -It does get chemo recommend: 5-FU with pump, carbo and pembro. Probability of years of control is less than 5-10%. It is a more  difficult regimen to tolerate.  -Not for curative intent.  -QOL can occur with this chemo regimen.  -More concerned that the chemo can by harmful.     -At prior visits: Would like to eat again.  Joint visit with Dr. Janann August, Dr. Darlin Coco, Adela Lank Dela Pe??a, PharmD and myself.  Clearly shared risk and benefits of continued eating and drinking.  Another goal is to be able to spend more time with his 2 grandchildren.  Discussed options of supportive care and not receiving chemotherapy and continuing with chemotherapy.  Walter Armstrong understands that the chemotherapy is for palliation and not curative intent.  -Plan is to try to continue to receive chemotherapy.  For now, chemo is on hold due to fatigue and need for palliative radiation therapy.  -Confirmed DNR/DNI status.    Health Care Decision Maker as of  HCDM [facility]): Walter Armstrong [phone]    HCDM (patient stated preference): Walter Armstrong - Mother - [phone]    7. Controlled substances risk management.   - Patient has a signed pain medication agreement with Outpatient Palliative Care, completed on previous date, as per standard of care and signed again on 12/25/20   - [place] database was reviewed today and it was appropriate.   - Urine drug screen was performed at this visit. Findings: appropriate.   - Patient has received information about safe storage and administration of medications.   - Patient has received a prescription for narcan; has been educated on its use. Patient's caregiver was not present for this discussion.     F/u:  6/14 during clinic or infusion  ----------------------------------------    HPI: 65 y.o.??male??with a PMHx of CKD3, MDD, hypothyroid and oropharyngeal SCC s/p induction chemo and CRT??2014 and new primary neck SCC s/p tracheostomy (11/02/20) and g-tube. On CbP/Tax/Pembro??that presented to [facility]??with abdominal pain and found to have constipation, neck fistulas, and GNR bacteremia hospitalized from 4/6-4/14. ENT was consulted and stated that he is not a surgical candidate and that he should not be taking anything by mouth as he has fistulas in his neck. SLP was also in agreement with his aspiration and infectious risk with his neck fistulas.    CT scans 12/25/20 showing bilateral recurrent aspiration as discussed, left greater than right and Redemonstrated sinus tract extending from the hypopharynx to the left supraclavicular subcutaneous soft tissues with persistent peripherally enhancing fluid collection is region. Redemonstrated adjacent soft tissue with at least 180 degree abutment of the left common carotid artery producing mild luminal narrowing. Similar lytic lesions of the C5 and C6 vertebral bodies. New superior endplate compression fracture of C7.            Primary symptom is anterior and posterior neck pain.  Feels that the pain is worsening in the posterior region.  When he takes the oxycodone 10 mg sublingually 6 times a day his pain does go down to 3-4/10, but pain relief does not last as long as he like it to.  Shares that it is frustrating having the tracheostomy and the neck pain.  He is worried about getting his opioid medications refilled.  Shares that he has been eating some eggs, potatoes and Pepsi.  Worried about the continued right foot swelling x1 month.  Endorsing some weakness but still overall able to live on his own.  Takes care of his own trach dressing and G-tube dressing.  Denying shortness of breath.  Does endorse feeling sad.  Does the bolus feeding tube by himself.  His bowel  movements have been every other day and he takes the Senokot tablets orally.  Has been taking all of his medications orally.        Interval hx 01/23/21 CK, Rosalee Kaufman MSW, Mr Walter Armstrong and Walter Armstrong    -Pain is better controlled  -Issue is my mind keeps on running. Concerned about his future.  -Taking all meds via g-tube except SL oxy IR  -No issues with constipation  -Energy is sl better  -Spent several nights with his brother Walter Armstrong and [place].    Interval history 02/05/2021 CK, Walter Armstrong and brother Walter Armstrong    -Pain remains controlled with current regimen of xtampza 18 mg twice daily and oxycodone 10 mg tabs 7/day  -Using 6 cans a G-tube per day and is losing weight.  -Endorsing some constipation.  Not using MiraLAX and Senokot on a regular basis.  -Mood is slightly better, but still worries about the future and has sadness.  -Confirms he is using the pillbox, but he brought in his medications and it still had medications that I had discontinued from 2 weeks ago.      Social:  Lives alone in [place], [place]. Visits with his  12 yo granddaughter Walter Armstrong every other day and his 2 year grandson. At initial PC visit:  His Grand dtr is a major source of inspiration for him -- he wants to be healthy so that he can continue to care for her as she grows up.  He spends his extra money on toys and clothes for her, buys her dresses to wear to church and toys for her to keep at his house.   government.  He is currently receiving disability.  His brother lives about 20 minutes and his son lives about 5 minutes away.  He does have a son and a daughter.  He is active with his church community.    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    Walter Armstrong his brother and sister-in-law are his primary caregivers.  He has a friend Walter Armstrong who checks on him every day.    Objective      Allergies: No Known Allergies     Current Outpatient Medications   Medication Sig Dispense Refill   ??? naloxone (NARCAN) 4 mg nasal spray One spray in either nostril once for known/suspected opioid overdose. May repeat every 2-3 minutes in alternating nostril til EMS arrives 2 each 0   ??? ondansetron (ZOFRAN-ODT) 8 MG disintegrating tablet Take 1 tablet (8 mg total) by mouth every eight (8) hours as needed for nausea. place tablet on tongue and allow to dissolve. Swallow with saliva (no need to administer with liquids). 30 tablet 1   ??? oxyCODONE (ROXICODONE) 10 mg immediate release tablet Take 1 tab (10 mg) under tongue every 3 hours as needed for pain with a Max 7 tabs/day. 210 tablet 0   ??? oxyCODONE myristate (XTAMPZA ER) 18 mg CSpT Take 18 mg by mouth every twelve (12) hours. 60 each 0   ??? pegfilgrastim-cbqv (UDENYCA) 6 mg/0.6 mL injection Inject the contents of 1 syringe (6 mg total) under the skin every twenty-one (21) days. Do not administer within 24 hours of chemotherapy. 0.6 mL 5   ??? prochlorperazine (COMPAZINE) 10 MG tablet 1 tablet (10 mg total) by G-tube route every six (6) hours as needed for nausea. 30 tablet 1   ??? senna (SENNA) 8.6 mg tablet Take 1 tablet by mouth Two (2) times a day. 60  tablet 11   ??? sertraline (ZOLOFT) 50 MG tablet 1 tablet (50 mg total) by G-tube route in the morning. 30 tablet 0   ??? gabapentin (NEURONTIN) 300 MG capsule 1 capsule (300 mg total) by G-tube route Three (3) times a day. 90 capsule 1   ??? levothyroxine (SYNTHROID) 150 MCG tablet 1 tablet (150 mcg total) by G-tube route daily. 30 tablet 0   ??? mirtazapine (REMERON) 15 MG tablet 1 tablet (15 mg total) by G-tube route nightly. 90 tablet 3   ??? polyethylene glycol (MIRALAX) 17 gram/dose powder 17 g by G-tube route two (2) times a day as needed (constipation). 850 g 3   ??? senna (SENOKOT) 8.6 mg tablet 2 tablets by G-tube route Two (2) times a day. Hold for diarrhea 120 tablet 2     No current facility-administered medications for this visit.       Past Medical History:   Past Medical History:   Diagnosis Date   ??? Arthritis     bil  knees   ??? Cancer (CMS-HCC)     SCC   ??? CKD (chronic kidney disease), stage III (CMS-HCC)    ??? Depressive disorder    ??? Hypothyroidism 09/11/2015   ??? Insomnia 07/24/2014   ??? Mass of throat 2015    treated with chemo and radiation    ??? Psychiatric Medication Trials     mirtazapine 15mg  x 2 months   ??? Substance abuse (CMS-HCC)     alcohol use disorder during college   ??? Substance Use Treatment History     h/o attending AA during college years     Personal and Social History:  Social History     Social History Narrative    Social Hx:    Relationship: divorced; was married x 10 y; No relationship; alot of lifetime partners all male, condoms some of the time        Kids: age 35 and 32; both doing well and live close by    Living situation: lives w/ his brother and brother's wife in [place], where he grew up.    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; last worked in 10/2012; currently on short term disability    Education: Licensed conveyancer for college    Social support: brother somewhat supportive; pt describes family as having limited understanding of survivorship issues; pt also reports sensing support from kids, church    Spiritual: attends church twice weekly though less engaged than previously    Presenter, broadcasting: enjoyed working in yard, helping around house when he was feeling better; follows college Engineer, structural for [employer]: traveled to [place], [place], [place], [place], no [place] or [place] or [place]     Family History:  Family History   Problem Relation Age of Onset   ??? Breast cancer Mother 32   ??? Hypertension Father    ??? Seizures Sister    ??? Tuberculosis Other         stepfather had pulmonary tb and treated when patient lived with him as kid   ??? No Known Problems Brother    ??? No Known Problems Maternal Aunt    ??? No Known Problems Maternal Uncle    ??? No Known Problems Paternal Aunt    ??? No Known Problems Paternal Uncle    ??? No Known Problems Maternal Grandmother    ??? No Known Problems Maternal Grandfather    ??? No Known Problems Paternal Grandmother    ???  No Known Problems Paternal Grandfather    ??? Drug abuse Neg Hx    ??? Depression Neg Hx    ??? Bipolar disorder Neg Hx    ??? Anxiety disorder Neg Hx    ??? Alcohol abuse Neg Hx    ??? Schizophrenia Neg Hx    ??? Physical abuse Neg Hx    ??? Anesthesia problems Neg Hx    ??? Broken bones Neg Hx    ??? Cancer Neg Hx    ??? Clotting disorder Neg Hx    ??? Collagen disease Neg Hx    ??? Diabetes Neg Hx    ??? Dislocations Neg Hx    ??? Fibromyalgia Neg Hx    ??? Gout Neg Hx    ??? Hemophilia Neg Hx    ??? Osteoporosis Neg Hx    ??? Rheumatologic disease Neg Hx    ??? Scoliosis Neg Hx    ??? Severe sprains Neg Hx    ??? Sickle cell anemia Neg Hx    ??? Spinal Compression Fracture Neg Hx      REVIEW OF SYSTEMS:  A comprehensive review of 10 systems was negative except for pertinent positives noted in HPI.    Palliative Performance Scale: 70% - Ambulation: Reduced / unable to do normal work, some evidence of disease / Self-Care: Full / Intake: Normal or reduced / Level of Conscious: Full     Opioid Risk Tool:    Male  Male    Family history of substance abuse      Alcohol  1  3    Illegal drugs  2  3    Rx drugs  4  4    Personal history of substance abuse      Alcohol  3  3    Illegal drugs  4  4    Rx drugs  5  5    Age between 16--45 years  1  1    History of preadolescent sexual abuse  3  0    Psychological disease      ADD, OCD, bipolar, schizophrenia  2  2    Depression  1  1      Total: 9  (<3 low risk, 4-7 moderate risk, >8 high risk)    Physical Exam:    Wt Readings from Last 6 Encounters:   02/05/21 63.8 kg (140 lb 9.6 oz)   01/23/21 65.7 kg (144 lb 14.4 oz)   01/15/21 67.4 kg (148 lb 11.2 oz)   01/15/21 67.1 kg (148 lb)   12/31/20 68 kg (150 lb)   12/25/20 65.7 kg (144 lb 14.4 oz)       GEN: Awake and alert & in no acute distress. Thin  PSYCH: Alert and oriented to person, place and time. Euthymic. More engaged and energetic. Using humor.  HEENT: Pupils equally round without scleral icterus. No facial asymmetry.  LUNGS: No increased work of breathing.  SKIN: No rashes, petechiae or jaundice noted on visible skin  EXT: No edema noted of the lower extremities  NEURO: Normal gait and coordination.       Lab Results   Component Value Date    CREATININE 1.38 (H) 12/25/2020     Lab Results   Component Value Date    ALKPHOS 97 12/25/2020    BILITOT 0.3 12/25/2020    BILIDIR 0.20 05/28/2017    PROT 6.8 12/25/2020    ALBUMIN 2.3 (L) 12/25/2020    ALT 9 (L) 12/25/2020  AST 16 12/25/2020          Burtis Junes, FNP-BC, [facility]  Outpatient Oncology Palliative Care Service  [facility]  [address] [place], [place] [address]  [phone]      Time spent with patient was 60 minutes.  Additional 15 minutes were spent on preparation, documenting and coordinating care.

## 2021-02-06 MED ORDER — SERTRALINE 50 MG TABLET
ORAL_TABLET | Freq: Every day | GASTROSTOMY | 0 refills | 30 days | Status: CP
Start: 2021-02-06 — End: 2021-03-08

## 2021-02-07 NOTE — Unmapped (Signed)
[facility] Specialty Medication Onboarding    Specialty Medication: Udenyca 6 mg/0.6 mL Injection  Prior Authorization: Approved   Financial Assistance: No - copay  <$25  Final Copay/Day Supply: $3.95 / 21    Insurance Restrictions: None     Notes to Pharmacist: n/a    The triage team has completed the benefits investigation and has determined that the patient is able to fill this medication at [facility]. Please contact the patient to complete the onboarding or follow up with the prescribing physician as needed.

## 2021-02-08 MED ORDER — DICLOFENAC 1 % TOPICAL GEL
0 days
Start: 2021-02-08 — End: ?

## 2021-02-11 DIAGNOSIS — C109 Malignant neoplasm of oropharynx, unspecified: Principal | ICD-10-CM

## 2021-02-12 ENCOUNTER — Ambulatory Visit: Admit: 2021-02-12 | Payer: MEDICARE

## 2021-02-12 ENCOUNTER — Ambulatory Visit: Admit: 2021-02-12 | Payer: MEDICARE | Attending: Hematology & Oncology | Primary: Hematology & Oncology

## 2021-02-12 MED ORDER — XTAMPZA ER 18 MG CAPSULE SPRINKLE
Freq: Two times a day (BID) | ORAL | 0 refills | 30 days | Status: CP
Start: 2021-02-12 — End: 2021-03-14

## 2021-02-12 MED ORDER — OXYCODONE 10 MG TABLET
ORAL_TABLET | 0 refills | 0 days | Status: CP
Start: 2021-02-12 — End: ?

## 2021-02-12 NOTE — Unmapped (Signed)
Thank you for your Home Health referral. [facility] and [facility] are unable to accommodate the ordered Home Health services for your patient currently we have reached our capacity level for new patients ( this includes those who have been with [place] before but are now discharged from services  We apologize for any inconvenience it may cause.  The following are some agencies that may have capacity. We Thank you for the referral.    In Network Providers:  [facility]  [phone]      [facility]  [phone]      [facility]  [phone]      [facility]  [phone]      [facility]  [phone]          [facility]  [phone]      [facility]   [phone]       Everardo Pacific- Sherilyn Cooter, LPN   Central Intake Nurse for [facility] [place], [place] and [place]

## 2021-02-12 NOTE — Unmapped (Signed)
Spoke with [name] and there was some confusion about today's visit.  They were unaware appointment was today.  Plan is to try to reschedule today's visit to next Tuesday.  Will connect with team.    Burtis Junes, FNP-BC, [facility]  Outpatient Oncology Palliative Care Service  [facility]  [address], [place], [place] [address]  [phone]

## 2021-02-18 NOTE — Unmapped (Signed)
Sent text to patient to remind him of his appointments tomorrow. Called [phone] and was told it was the wrong number (trying to reach brother [name]). Left voice mail on [phone], but without much detail as there was no identifier on the voice message that it was the correct number.

## 2021-02-19 ENCOUNTER — Other Ambulatory Visit
Admit: 2021-02-19 | Discharge: 2021-02-20 | Disposition: A | Discharge status: Hospice | Payer: MEDICARE | Admitting: Student in an Organized Health Care Education/Training Program

## 2021-02-19 ENCOUNTER — Ambulatory Visit
Admit: 2021-02-19 | Discharge: 2021-02-20 | Disposition: A | Discharge status: Hospice | Payer: MEDICARE | Admitting: Student in an Organized Health Care Education/Training Program

## 2021-02-19 ENCOUNTER — Ambulatory Visit
Admit: 2021-02-19 | Discharge: 2021-02-20 | Disposition: A | Discharge status: Hospice | Payer: MEDICARE | Attending: Registered" | Admitting: Student in an Organized Health Care Education/Training Program | Primary: Registered"

## 2021-02-19 ENCOUNTER — Ambulatory Visit
Admit: 2021-02-19 | Discharge: 2021-02-20 | Disposition: A | Discharge status: Hospice | Payer: MEDICARE | Attending: Hematology & Oncology | Admitting: Student in an Organized Health Care Education/Training Program | Primary: Hematology & Oncology

## 2021-02-19 ENCOUNTER — Ambulatory Visit: Admit: 2021-02-19 | Discharge: 2021-02-20 | Payer: MEDICARE | Attending: Clinical | Primary: Clinical

## 2021-02-19 DIAGNOSIS — C109 Malignant neoplasm of oropharynx, unspecified: Principal | ICD-10-CM

## 2021-02-19 DIAGNOSIS — Z515 Encounter for palliative care: Principal | ICD-10-CM

## 2021-02-19 DIAGNOSIS — F32A Depression, unspecified depression type: Principal | ICD-10-CM

## 2021-02-19 DIAGNOSIS — G893 Neoplasm related pain (acute) (chronic): Principal | ICD-10-CM

## 2021-02-19 DIAGNOSIS — R4189 Other symptoms and signs involving cognitive functions and awareness: Principal | ICD-10-CM

## 2021-02-19 DIAGNOSIS — C760 Malignant neoplasm of head, face and neck: Principal | ICD-10-CM

## 2021-02-19 DIAGNOSIS — R9389 Abnormal findings on diagnostic imaging of other specified body structures: Principal | ICD-10-CM

## 2021-02-19 DIAGNOSIS — R29818 Other symptoms and signs involving the nervous system: Principal | ICD-10-CM

## 2021-02-19 DIAGNOSIS — R400 Somnolence: Principal | ICD-10-CM

## 2021-02-19 LAB — COMPREHENSIVE METABOLIC PANEL
ALBUMIN: 3 g/dL — ABNORMAL LOW (ref 3.4–5.0)
ALKALINE PHOSPHATASE: 107 U/L (ref 46–116)
ALT (SGPT): 22 U/L (ref 10–49)
ANION GAP: 2 mmol/L — ABNORMAL LOW (ref 5–14)
AST (SGOT): 22 U/L (ref <=34)
BILIRUBIN TOTAL: 0.3 mg/dL (ref 0.3–1.2)
BLOOD UREA NITROGEN: 43 mg/dL — ABNORMAL HIGH (ref 9–23)
BUN / CREAT RATIO: 22
CALCIUM: 9.4 mg/dL (ref 8.7–10.4)
CHLORIDE: 93 mmol/L — ABNORMAL LOW (ref 98–107)
CO2: 34 mmol/L — ABNORMAL HIGH (ref 20.0–31.0)
CREATININE: 1.92 mg/dL — ABNORMAL HIGH
EGFR CKD-EPI (2021) MALE: 38 mL/min/{1.73_m2} — ABNORMAL LOW (ref >=60)
GLUCOSE RANDOM: 103 mg/dL (ref 70–179)
POTASSIUM: 4.7 mmol/L (ref 3.4–4.8)
PROTEIN TOTAL: 8.7 g/dL — ABNORMAL HIGH (ref 5.7–8.2)
SODIUM: 129 mmol/L — ABNORMAL LOW (ref 135–145)

## 2021-02-19 LAB — CBC W/ AUTO DIFF
BASOPHILS ABSOLUTE COUNT: 0.1 10*9/L (ref 0.0–0.1)
BASOPHILS RELATIVE PERCENT: 0.7 %
EOSINOPHILS ABSOLUTE COUNT: 0.2 10*9/L (ref 0.0–0.5)
EOSINOPHILS RELATIVE PERCENT: 2 %
HEMATOCRIT: 30.4 % — ABNORMAL LOW (ref 39.0–48.0)
HEMOGLOBIN: 9.9 g/dL — ABNORMAL LOW (ref 12.9–16.5)
LYMPHOCYTES ABSOLUTE COUNT: 1.4 10*9/L (ref 1.1–3.6)
LYMPHOCYTES RELATIVE PERCENT: 14.4 %
MEAN CORPUSCULAR HEMOGLOBIN CONC: 32.7 g/dL (ref 32.0–36.0)
MEAN CORPUSCULAR HEMOGLOBIN: 28.5 pg (ref 25.9–32.4)
MEAN CORPUSCULAR VOLUME: 87.2 fL (ref 77.6–95.7)
MEAN PLATELET VOLUME: 8.1 fL (ref 6.8–10.7)
MONOCYTES ABSOLUTE COUNT: 1.4 10*9/L — ABNORMAL HIGH (ref 0.3–0.8)
MONOCYTES RELATIVE PERCENT: 14.4 %
NEUTROPHILS ABSOLUTE COUNT: 6.6 10*9/L (ref 1.8–7.8)
NEUTROPHILS RELATIVE PERCENT: 68.5 %
PLATELET COUNT: 287 10*9/L (ref 150–450)
RED BLOOD CELL COUNT: 3.48 10*12/L — ABNORMAL LOW (ref 4.26–5.60)
RED CELL DISTRIBUTION WIDTH: 15.1 % (ref 12.2–15.2)
WBC ADJUSTED: 9.6 10*9/L (ref 3.6–11.2)

## 2021-02-19 LAB — MAGNESIUM: MAGNESIUM: 1.9 mg/dL (ref 1.6–2.6)

## 2021-02-19 MED ORDER — XTAMPZA ER 9 MG CAPSULE SPRINKLE
Freq: Two times a day (BID) | ORAL | 0 refills | 0.00000 days | Status: CP
Start: 2021-02-19 — End: 2021-02-19

## 2021-02-19 MED ORDER — LEVOTHYROXINE 150 MCG TABLET
ORAL_TABLET | Freq: Every day | GASTROSTOMY | 0 refills | 30 days | Status: CP
Start: 2021-02-19 — End: 2021-03-21

## 2021-02-19 MED ORDER — SERTRALINE 50 MG TABLET
ORAL_TABLET | Freq: Every day | GASTROSTOMY | 0 refills | 30 days | Status: CP
Start: 2021-02-19 — End: 2021-03-21

## 2021-02-19 MED ADMIN — piperacillin-tazobactam (ZOSYN) 3.375 g in sodium chloride 0.9 % (NS) 100 mL IVPB-connector bag: 3.375 g | INTRAVENOUS | Stop: 2021-02-19

## 2021-02-19 MED ADMIN — vancomycin (VANCOCIN) 1250 mg in sodium chloride (NS) 0.9 % 250 mL IVPB (premix): 1250 mg | INTRAVENOUS | Stop: 2021-02-19

## 2021-02-19 NOTE — Unmapped (Signed)
[facility] Emergency Department Provider Note    ED Clinical Impression     Final diagnoses:   Aspiration into lower respiratory tract, initial encounter (Primary)   Hypoxia       History     CHIEF COMPLAINT:   Chief Complaint   Patient presents with   ??? Respiratory Problem       HPI: Walter Armstrong is a 65 y.o. 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  Initial plan was to discharge to home hospice but due to patient's new oxygen requirement increasing somnolence is brought down to the ED.  Patient remains alert and oriented x4 denies any complaints.  He was ambulatory per nursing staff today.  There was a reported fall patient denies any headache or numbness or weakness.  He does complain of some shortness of breath denies any chest pain fevers or chills.  Patient's family not at bedside reportedly left and are returning around 5 PM.    PAST MEDICAL HISTORY/PAST SURGICAL HISTORY:   Past Medical History:   Diagnosis Date   ??? Arthritis     bil  knees   ??? Cancer (CMS-HCC)     SCC   ??? CKD (chronic kidney disease), stage III (CMS-HCC)    ??? Depressive disorder    ??? Hypothyroidism 09/11/2015   ??? Insomnia 07/24/2014   ??? Mass of throat 2015    treated with chemo and radiation    ??? Psychiatric Medication Trials     mirtazapine 15mg  x 2 months   ??? Substance abuse (CMS-HCC)     alcohol use disorder during college   ??? Substance Use Treatment History     h/o attending AA during college years       Past Surgical History:   Procedure Laterality Date   ??? CHEMOTHERAPY      NON-BREAST RELATED   ??? KNEE ARTHROSCOPY Bilateral 1976   ??? KNEE SURGERY Bilateral    ??? PR BRONCHOSCOPY,DIAGNOSTIC N/A 12/27/2012    Procedure: BRONCHOSCOPY, RIGID OR FLEXIBLE, W/WO FLUOROSCOPIC GUIDANCE; DIAGNOSTIC, WITH CELL WASHING, WHEN PERFORMED; Surgeon: Bethel Born, MD;  Location: MAIN OR [facility];  Service: ENT   ??? PR CLOSURE OF GASTROSTOMY,SURGICAL N/A 04/17/2015    Procedure: CLOSURE OF GASTROSTOMY, SURGICAL;  Surgeon: Rema Fendt, MD;  Location: MAIN [facility];  Service: Surgical Oncology   ??? PR COLONOSCOPY FLX DX W/COLLJ SPEC WHEN PFRMD N/A 03/27/2014    Procedure: COLONOSCOPY, FLEXIBLE, PROXIMAL TO SPLENIC FLEXURE; DIAGNOSTIC, W/WO COLLECTION SPECIMEN BY BRUSH OR WASH;  Surgeon: Zetta Bills, MD;  Location: GI PROCEDURES [facility];  Service: Gastroenterology   ??? PR COLONOSCOPY FLX DX W/COLLJ SPEC WHEN PFRMD N/A 06/13/2016    Procedure: COLONOSCOPY, FLEXIBLE, PROXIMAL TO SPLENIC FLEXURE; DIAGNOSTIC, W/WO COLLECTION SPECIMEN BY BRUSH OR WASH;  Surgeon: Liane Comber, MD;  Location: [facility]ED[facility];  Service: Gastroenterology   ??? PR COLONOSCOPY FLX DX W/COLLJ SPEC WHEN PFRMD N/A 08/11/2018    Procedure: COLONOSCOPY, FLEXIBLE, PROXIMAL TO SPLENIC FLEXURE; DIAGNOSTIC, W/WO COLLECTION SPECIMEN BY BRUSH OR WASH;  Surgeon: Chriss Driver, MD;  [facility];  Service: Gastroenterology   ??? PR CPTR-ASST SURGICAL NAVIGATION IMAGE-LESS Right 12/25/2015    Procedure: COMPUTER-ASSISTED SURGICAL NAVIGATIONAL PROCEDURE FOR MUSCULOSKELETAL PROCEDURES; IMAGE-LESS;  Surgeon: Malka So, MD;  Location: [facility] OR [facility];  Service: Orthopedics   ??? PR CPTR-ASST SURGICAL NAVIGATION IMAGE-LESS Left 05/26/2017    Procedure: COMPUTER-ASSISTED SURGICAL NAVIGATIONAL PROCEDURE FOR MUSCULOSKELETAL PROCEDURES; IMAGE-LESS;  Surgeon: Malka So, MD;  Location: [facility] OR [facility];  Service: Ortho Joint Replacement   ??? PR EGD FLEXIBLE FOREIGN BODY REMOVAL N/A 09/10/2015    Procedure: UGI ENDOSCOPY; W/REMOVAL FOREIGN BODY;  Surgeon: Lanier Ensign, MD;  Location: MAIN OR [facility];  Service: Gastroenterology   ??? PR ESOPHAGOSCOPY,DIAGNOSTIC N/A 12/27/2012    Procedure: ESOPHAGOSCOPY, RIGID OR FLEXIBLE; DIAGNOSTIC, W/WO COLLECTION OF SPECIMEN(S) BY BRUSHING OR WASHING;  Surgeon: Bethel Born, MD;  Location: MAIN OR [place];  Service: ENT   ??? PR ESOPHAGOSCOPY,DIAGNOSTIC N/A 10/03/2020    Procedure: ESOPHAGOSCOPY, RIGID OR FLEXIBLE; DIAGNOSTIC, W/WO COLLECTION OF SPECIMEN(S) BY BRUSHING OR WASHING;  Surgeon: Karren Burly, MD;  Location: MAIN OR [place];  Service: ENT   ??? PR EXPLORATION N/FLWD SURG NECK ARTERY Bilateral 10/29/2020    Procedure: EXPLORATION NOT FOLLOWED BY SURGICAL REPAIR, ARTERY; NECK (EG, CAROTID, SUBCLAVIAN);  Surgeon: Karren Burly, MD;  Location: MAIN OR [facility];  Service: ENT   ??? PR LAP,GASTROSTOMY,W/O TUBE CONSTR N/A 10/05/2020    Procedure: LAPAROSCOPY, SURGICAL; GASTOSTOMY W/O CONSTRUCTION OF GASTRIC TUBE (EG, STAMM PROCEDURE)(SEPARATE PROCED);  Surgeon: Camillo Flaming, MD;  Location: MAIN OR [facility];  Service: Surgical Oncology   ??? PR LARYNGOSCOPY,DIRECT,DIAGNOSTIC N/A 12/27/2012    Procedure: LARYNGOSCOPY DIRECT, WITH OR WITHOUT TRACHEOSCOPY; DIAGNOSTIC, EXCEPT NEWBORN;  Surgeon: Bethel Born, MD;  Location: MAIN OR [place];  Service: ENT   ??? PR LARYNGOSCOPY,DIRECT,DX,OP MICROSCOP N/A 10/03/2020    Procedure: LARYNGOSCOPY DIRECT WITH OR WITHOUT TRACHEOSCPY; DIAGNOSTIC, WITH OPERATING MICROSCOPE OR TELESCOPE;  Surgeon: Karren Burly, MD;  Location: MAIN OR [facility];  Service: ENT   ??? PR REPAIR ING HERNIA,5+Y/O,REDUCIBL Left 04/04/2018    Procedure: REPAIR INITIAL INGUINAL HERNIA, AGE 71 YEARS OR OLDER; REDUCIBLE;  Surgeon: Katherina Mires, MD;  Location: MAIN OR [facility];  Service: Trauma   ??? PR TOTAL KNEE ARTHROPLASTY Right 12/25/2015    Procedure: ARTHROPLASTY, KNEE, CONDYLE & PLATEAU; MEDIAL & LAT COMPARTMENT W/WO PATELLA RESURFACE (TOTAL KNEE ARTHROP);  Surgeon: Malka So, MD;  Location: [facility] OR [facility];  Service: Orthopedics   ??? PR TOTAL KNEE ARTHROPLASTY Left 05/26/2017    Procedure: ARTHROPLASTY, KNEE, CONDYLE & PLATEAU; MEDIAL & LAT COMPARTMENT W/WO PATELLA RESURFACE (TOTAL KNEE ARTHROP);  Surgeon: Malka So, MD;  Location: [facility] OR [facility];  Service: Ortho Joint Replacement   ??? PR TRACHEOSTOMY, PLANNED N/A 11/02/2020    Procedure: AWAKE TRACHEOSTOMY;  Surgeon: Karren Burly, MD;  Location: MAIN OR [place];  Service: ENT   ??? PR UP GI ENDOSCOPY,DILATN W GUIDE N/A 09/27/2015    Procedure: UGI ENDOSCOPY; WITH INSERTION OF GUIDE WIRE FOLLOWED BY DILATION OF ESOPHAGUS OVER GUIDE WIRE;  Surgeon: Lanier Ensign, MD;  [facility];  Service: Gastroenterology   ??? PR UP GI ENDOSCOPY,DILATN W GUIDE N/A 07/10/2016    Procedure: UGI ENDOSCOPY; WITH INSERTION OF GUIDE WIRE FOLLOWED BY DILATION OF ESOPHAGUS OVER GUIDE WIRE;  Surgeon: Liane Comber, MD;  [facility];  Service: Gastroenterology   ??? PR UP GI ENDOSCOPY,DILATN W GUIDE N/A 07/31/2016    Procedure: UGI ENDOSCOPY; WITH INSERTION OF GUIDE WIRE FOLLOWED BY DILATION OF ESOPHAGUS OVER GUIDE WIRE;  Surgeon: Liane Comber, MD;  [facility];  Service: Gastroenterology   ???  PR UPPER GI ENDOSCOPY,BIOPSY N/A 09/27/2015    Procedure: UGI ENDOSCOPY; WITH BIOPSY, SINGLE OR MULTIPLE;  Surgeon: Lanier Ensign, MD;  [facility];  Service: Gastroenterology   ??? PR UPPER GI ENDOSCOPY,BIOPSY N/A 06/13/2016    Procedure: UGI ENDOSCOPY; WITH BIOPSY, SINGLE OR MULTIPLE;  Surgeon: Liane Comber, MD;  Location: [facility] PROCED[facility];  Service: Gastroenterology   ??? PR UPPER GI ENDOSCOPY,DIAGNOSIS N/A 08/04/2013    Procedure: UGI ENDO, INCLUDE ESOPHAGUS, STOMACH, & DUODENUM &/OR JEJUNUM; DX W/WO COLLECTION SPECIMN, BY BRUSH OR WASH;  Surgeon: Brendia Sacks, MD;  Location: [facility] PROCEDURES [facility] [facility];  Service: Gastroenterology   ??? PR UPPER GI ENDOSCOPY,DIAGNOSIS N/A 08/06/2016    Procedure: UGI ENDO, INCLUDE ESOPHAGUS, STOMACH, & DUODENUM &/OR JEJUNUM; DX W/WO COLLECTION SPECIMN, BY BRUSH OR WASH;  Surgeon: Zetta Bills, MD;  [facility]: [facility];  Service: Gastroenterology   ??? RADIATION      NON-BREAST RELATED       MEDICATIONS:   No current facility-administered medications for this encounter.    Current Outpatient Medications:   ???  diclofenac sodium (VOLTAREN) 1 % gel, , Disp: , Rfl:   ???  levothyroxine (SYNTHROID) 150 MCG tablet, 1 tablet (150 mcg total) by G-tube route in the morning., Disp: 30 tablet, Rfl: 0  ???  mirtazapine (REMERON) 15 MG tablet, 1 tablet (15 mg total) by G-tube route nightly., Disp: 90 tablet, Rfl: 3  ???  naloxone (NARCAN) 4 mg nasal spray, One spray in either nostril once for known/suspected opioid overdose. May repeat every 2-3 minutes in alternating nostril til EMS arrives, Disp: 2 each, Rfl: 0  ???  ondansetron (ZOFRAN-ODT) 8 MG disintegrating tablet, Take 1 tablet (8 mg total) by mouth every eight (8) hours as needed for nausea. place tablet on tongue and allow to dissolve. Swallow with saliva (no need to administer with liquids)., Disp: 30 tablet, Rfl: 1  ???  oxyCODONE (ROXICODONE) 10 mg immediate release tablet, Take 1 tab (10 mg) under tongue every 3 hours as needed for pain with a Max 7 tabs/day. (Patient taking differently: Take 10 mg by mouth every four (4) hours as needed. Take 1 tab (10 mg) under tongue every 4hours as needed for pain with a Max 5 tabs/day. DOSE CHANGE BY CK ON 02/19/21), Disp: 210 tablet, Rfl: 0  ???  oxyCODONE myristate (XTAMPZA ER) 9 mg CSpT, 9 mg by G-tube route every twelve (12) hours. X 14 days. Dose change 02/19/21. Use this script-med should be via g-tube not orally., Disp: 28 each, Rfl: 0  ???  polyethylene glycol (MIRALAX) 17 gram/dose powder, 17 g by G-tube route two (2) times a day as needed (constipation)., Disp: 850 g, Rfl: 3  ???  prochlorperazine (COMPAZINE) 10 MG tablet, 1 tablet (10 mg total) by G-tube route every six (6) hours as needed for nausea., Disp: 30 tablet, Rfl: 1  ???  senna (SENNA) 8.6 mg tablet, Take 1 tablet by mouth Two (2) times a day., Disp: 60 tablet, Rfl: 11  ???  senna (SENOKOT) 8.6 mg tablet, 2 tablets by G-tube route Two (2) times a day. Hold for diarrhea, Disp: 120 tablet, Rfl: 2  ???  sertraline (ZOLOFT) 50 MG tablet, 1 tablet (50 mg total) by G-tube route in the morning., Disp: 30 tablet, Rfl: 0    ALLERGIES:   Patient has no known allergies.    SOCIAL HISTORY:   Social History     Tobacco Use   ???  Smoking status: Current Every Day Smoker     Years: 15.00     Types: Cigars     Start date: 04/03/1988   ??? Smokeless tobacco: Never Used   ??? Tobacco comment: 3-4 little cigars/day with intent to quit   Substance Use Topics   ??? Alcohol use: No     Alcohol/week: 0.0 standard drinks     Comment: quit 20 years ago       FAMILY HISTORY:  Family History   Problem Relation Age of Onset   ??? Breast cancer Mother 14   ??? Hypertension Father    ??? Seizures Sister    ??? Tuberculosis Other         stepfather had pulmonary tb and treated when patient lived with him as kid   ??? No Known Problems Brother    ??? No Known Problems Maternal Aunt    ??? No Known Problems Maternal Uncle    ??? No Known Problems Paternal Aunt    ??? No Known Problems Paternal Uncle    ??? No Known Problems Maternal Grandmother    ??? No Known Problems Maternal Grandfather    ??? No Known Problems Paternal Grandmother    ??? No Known Problems Paternal Grandfather    ??? Drug abuse Neg Hx    ??? Depression Neg Hx    ??? Bipolar disorder Neg Hx    ??? Anxiety disorder Neg Hx    ??? Alcohol abuse Neg Hx    ??? Schizophrenia Neg Hx    ??? Physical abuse Neg Hx    ??? Anesthesia problems Neg Hx    ??? Broken bones Neg Hx    ??? Cancer Neg Hx    ??? Clotting disorder Neg Hx    ??? Collagen disease Neg Hx    ??? Diabetes Neg Hx    ??? Dislocations Neg Hx    ??? Fibromyalgia Neg Hx    ??? Gout Neg Hx    ??? Hemophilia Neg Hx    ??? Osteoporosis Neg Hx    ??? Rheumatologic disease Neg Hx    ??? Scoliosis Neg Hx    ??? Severe sprains Neg Hx    ??? Sickle cell anemia Neg Hx    ??? Spinal Compression Fracture Neg Hx           Review of Systems    A 10 point review of systems was performed and is negative other than positive elements noted in HPI   Constitutional: Negative for fever, diaphoresis, weight loss.  Eyes: Negative for visual changes.  ENT: Negative for sore throat.  Cardiovascular: Negative for chest pain, palpitations.  Respiratory: Negative for cough.  Gastrointestinal: Negative for abdominal pain, nausea, vomiting or diarrhea.   Genitourinary: Negative for dysuria, urinary frequency.  Musculoskeletal: Negative for back pain, myalgias.  Skin: Negative for rash.  Neurological: Negative for headaches, focal weakness or numbness.    Physical Exam     VITAL SIGNS:    BP 139/93  - Pulse 84  - Temp 37 ??C (98.6 ??F)  - Resp 18  - SpO2 96%     Constitutional: Alert and oriented x4.  Chronically ill appearing cachectic elderly male, tracheostomy in place no anterior neck on 4 L O2 via nasal cannula and in no acute distress.  Eyes: Conjunctivae are normal.  ENT       Head: Normocephalic and atraumatic.       Nose: No congestion.       Mouth/Throat: Mucous membranes  are moist.       Neck: No stridor.  Tracheostomy site draining thin brown liquid after removal speaking valve.  Hematological/Lymphatic/Immunological: No cervical lymphadenopathy.  Cardiovascular: Heart rate as documented above. Regular rhythm.  Single S1, S2. 2+ and symmetric distal pulses are present in all extremities. Warm and well perfused. Brisk capillary refill.   Respiratory:  Normal respiratory rate, mildly increased work of breathing, coarse crackles bilaterally worse in the lung bases.  Gastrointestinal: Soft and nontender.  G-tube in site without any surrounding erythema or tenderness.  There is no CVA tenderness bilaterally.  Musculoskeletal: Normal active range of motion in all extremities.       Right lower leg: No tenderness. No edema.       Left lower leg: No tenderness. No edema.  Neurologic: Normal speech and language. No gross focal neurologic deficits are appreciated.  Skin: Skin is warm, dry and intact. No rash noted.  Psychiatric: Mood and affect are normal. Speech and behavior are normal.    ED Course, Assessment and Plan     Initial Clinical Impression:    February 19, 2021 4:13 PM   Tycen Sarita Haver is a 65 y.o. 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 On exam, pt is hemodynamically stable and afebrile.   Physical exam revealed very coarse breath sounds.  Patient requiring 4 L O2 via nasal cannula escalating amount of care.  He is having tracheal discharge from his trachea thin brown fluid.    BP 139/93  - Pulse 84  - Temp 37 ??C (98.6 ??F)  - Resp 18  - SpO2 96%     Medical decision making: Based on patient's history and clinical presentation concern for aspiration.  Patient was aspirate was suctioned of the trachea with about 40 cc of thin brown fluid consistent with enteral feeds from his G-tube.  Suspect patient likely had an aspiration event causing his hypoxia.  Review of patient's notes today show that he did possibly also may have had opiate intoxication however it was requested not to provide Narcan due to reversal of analgesic effects and a cancer hospice patient.  Patient continues to maintain his airway with his trach however suspect he has likely aspirated causing his hypoxia based on his clinical presentation and our aspirate.  We will send aspirate for culture to further evaluate.  Will obtain chest x-ray to further evaluate.  Less suspicion for other acute intrathoracic etiology including pneumothorax, ACS, PE.  We will discussed with hospice, case management and palliative care team if needed for disposition as he is plan to go to home hospice today.    Will treat patient with:     Medications - No data to display    Diagnostic orders as below.     Orders Placed This Encounter Procedures   ??? Lower Respiratory Culture   ??? XR Chest Portable   ??? ED Admit Decision       ED Course:  ED Course as of 02/19/21 1836   Tue Feb 19, 2021   1744 Case was discussed with case management and home hospice representative Diane.  They indicate that initially it was thought patient was able to go home and his neighbor Ferne Reus would be taking care of him however given patient's new oxygen requirement and concern for  aspiration pneumonia discussions were had with [name] and [name]'s that he could only take care of patient for 1 night.  There is no oxygen for patient at home or hospital bed.  Believe resources available to patient at this time not appropriate for discharge home given he continues to have 10 L by blow-by tracheal mask oxygen requirement with chest x-ray indicative of aspiration pneumonitis/pneumonia consistent with his tracheal aspirate of 3+ gram-positive organisms greater than 25 PMNs consistent with possible aspiration of enteric feeds.  Given this clinical change and need for further coordination for safe discharge home and appropriate resources at home hospice believe patient warrants admission for observation and management until home hospice can have appropriate resources at patient's home.   1836 Patient's friend Ferne Reus is at bedside.  Discussed with him plan for admission for observation until patient can have appropriate oxygen set up at home.  He is agreeable with this plan.  Patient remains on 10 L O2 by blow-by trach cuff.  Patient will be admitted to med O team.         _____________________________________________________________________    The case was discussed with attending physician who is in agreement with the above assessment and plan    Additional Medical Decision Making     I have reviewed the vital signs and the nursing notes. Labs and radiology results that were available during my care of the patient were independently reviewed by me and considered in my medical decision making. I independently visualized the EKG tracing if performed  I independently visualized the radiology images if performed  I reviewed the patient's prior medical records if available.  Additional history obtained from family if available    Radiology     XR Chest Portable   Final Result   Bibasilar left greater than right streaky and nodular airspace opacities as well as left midlung opacities concerning for aspiration/pneumonia          Labs     Labs Reviewed   LOWER RESPIRATORY CULTURE    Narrative:     Specimen Source: Tracheal aspirate       Pertinent labs & imaging results that were available during my care of the patient were reviewed by me and considered in my medical decision making (see chart for details).    Please note- This chart has been created using [employer]. Chart creation errors have been sought, but may not always be located and such creation errors, especially pronoun confusion, do NOT reflect on the standard of medical care.    Alycia Rossetti, MD  EM PGY-1     Norva Riffle, MD  Resident  02/19/21 [phone]

## 2021-02-19 NOTE — Unmapped (Signed)
Bed: 81-D  Expected date:   Expected time:   Means of arrival:   Comments:  Museum/gallery curator

## 2021-02-19 NOTE — Unmapped (Signed)
Code medic called per MD and palliative NP, post patient falling in room and O2 90% on 4L.Marland Kitchen Pt had been found sitting on the floor after what looked like an attempt to void in the trash can. No pain or head injury reported. Pt assisted to his feet with 2 person assist and placed in sitting position. VS rechecked at this time. Palliative NP evaluated in room, followed by oncology provider, Dr. Janann August. While patient has a palliative plan to go home with hospice, decision was made to transport patient to the ED. Both teams insist no narcan be administered to this hospice bound patient d/t pain management being a priority vs further interventions. ED nurse to room after code medic called and hand off given with providers present. Pt transported to the ED via stretcher.

## 2021-02-19 NOTE — Unmapped (Signed)
As we discussed today, we are changing:  - Oxycodone: new maximum 5 times/day  - Xtampza - decreasing from 18 mg to 9 mg twice a day  - Please make sure you have Narcan prescription at home    Here is some additional information about [facility] Outpatient Oncology Palliative Care:     What is Palliative Care?  Palliative Care is medical care that focuses on managing symptoms of cancer or side effects from cancer treatment. The Outpatient Oncology Palliative Care service uses a team approach to help patients and their caregivers with the goal of maintaining the best quality of life possible during a cancer diagnosis and cancer treatment.   The Outpatient Oncology Palliative Care team serves all outpatient oncology clinics including surgery, gynecology, medicine and radiation oncology.      Who provides Outpatient Palliative Care?  The Outpatient Oncology Palliative Care team includes doctors, nurses, pharmacists, social workers, counselors and nutritionists. The team works with a patient's primary oncology team to create care plans that can help manage symptoms related to cancer and cancer treatment.     What services are offered by Outpatient Oncology Palliative Care?  We can help cancer patients with:  Pain  Symptoms such as nausea, vomiting, anxiety, depression, fatigue, loss of appetite of shortness of breath  Emotional, psychological, or spiritual suffering  Nutritional problems  Concern about medical decisions  Difficulty communicating values, goals and personal choices.     Who are the Outpatient Oncology Palliative Care team members?  Physicians: Ulanda Edison, MD; Darrin Nipper, MD; Mickie Hillier, MD; Kathlynn Grate, MD  Physician Fellows: Theodoro Clock, MD; Remus Blake, MD; Payton Doughty, MD; Flora Lipps, MD  Nurse Practitioner: Burtis Junes, FNP  Clinical Pharmacist Practitioner: Hector Shade, PharmD, CPP  Nurse Clinical Coordinator: Allegra Lai, RN, BSN, MS, OCN  Administrative Specialist: Dolores Frame     When can I see the Outpatient Oncology Palliative Care team and how do I make contact in between visits?  We see patients Monday through Friday 8am - 4:30pm. If you have questions or concerns during clinic hours, please contact us. Any requests made outside of business hours will be addressed the following business day.      Palliative Care nurse line: [phone].    For palliative care appointments & questions Monday through Friday 8 AM-- 4:30 PM:  please call [phone].    For [facility] general questions Monday through Friday 8 AM-- 4:30 PM:  call [phone] or Toll free [phone].       On Nights, Weekends and Holidays:  Call [phone] and ask for the Oncologist on call.

## 2021-02-19 NOTE — Unmapped (Addendum)
Referral to home hospice to [facility] [phone]      Pts residential address is:  [address] [address] [place].

## 2021-02-19 NOTE — Unmapped (Signed)
OUTPATIENT ONCOLOGY PALLIATIVE CARE    Seen with Burtis Junes NP; I was present for discussion on medication management.    Assisted with medication reconciliation; pt brought in pill box and medication bottles; identified pills via medication verification app and resorted to reflect updated medication directions. Organized pill box; appropriately disposed of discontinued medications.    Plan is for pt to enroll in hospice. Sent refills for 30 days supplies of sertraline and levothyroxine as pill count showed pt will run out in the next 10 days.       Time spent with patient was 50 min.    Hector Shade, PharmD, CPP  Outpatient Oncology Palliative Care  February 19, 2021 1:26 PM

## 2021-02-19 NOTE — Unmapped (Signed)
Head and Neck Oncology Follow-up Visit    HPI:  Walter Armstrong is a 65 y.o. man with h/o heavy tobacco and alcohol use,??prior T4N2bM0 SCC of the oropharynx (HPV, p16 negative) s/p [id number] induction study (induction chemo followed by CRT) in 2014, now found to have new primary SCC after developing worsening dysphagia and weight loss. He was found to have new hypopharyngeal lesion on MBSS and ENT evaluation with DL. Biopsy of a post-cricoid lesion 10/03/20 and pathology confirming new p16 negative SCC.??Oncology was consulted after he was found to have inoperable mass during ENT evaluation 2/28 in the OR due to gross tumor extension into prevertebral fascia and bilateral carotid sheaths. He was planned to follow up on outpatient basis for treatment planning, but returned to the ED on 3/4 with respiratory distress and stridor. Flexible fiberoptic laryngoscopy done demonstrated diffuse lateral and posterior pharyngeal wall edema with limited vocal fold mobility. He was taken to the OR for an awake tracheostomy placement on 11/02/20.  Followed up afterwards and started on CbP/Tax/Pembro (C1D1 on 11/12/20) and received C2D1 on 12/03/20.  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  He returns today to discuss further options (XRT, systemic Rx).    Review of Systems: All other systems reviewed and negative except as per HPI    Meds:    Current Outpatient Medications:   ???  diclofenac sodium (VOLTAREN) 1 % gel, , Disp: , Rfl:   ???  naloxone (NARCAN) 4 mg nasal spray, One spray in either nostril once for known/suspected opioid overdose. May repeat every 2-3 minutes in alternating nostril til EMS arrives, Disp: 2 each, Rfl: 0  ???  ondansetron (ZOFRAN-ODT) 8 MG disintegrating tablet, Take 1 tablet (8 mg total) by mouth every eight (8) hours as needed for nausea. place tablet on tongue and allow to dissolve. Swallow with saliva (no need to administer with liquids)., Disp: 30 tablet, Rfl: 1  ???  oxyCODONE (ROXICODONE) 10 mg immediate release tablet, Take 1 tab (10 mg) under tongue every 3 hours as needed for pain with a Max 7 tabs/day., Disp: 210 tablet, Rfl: 0  ???  oxyCODONE myristate (XTAMPZA ER) 18 mg CSpT, Take 18 mg by mouth every twelve (12) hours., Disp: 60 each, Rfl: 0  ???  pegfilgrastim-cbqv (UDENYCA) 6 mg/0.6 mL injection, Inject the contents of 1 syringe (6 mg total) under the skin every twenty-one (21) days. Do not administer within 24 hours of chemotherapy., Disp: 0.6 mL, Rfl: 5  ???  polyethylene glycol (MIRALAX) 17 gram/dose powder, 17 g by G-tube route two (2) times a day as needed (constipation)., Disp: 850 g, Rfl: 3  ???  prochlorperazine (COMPAZINE) 10 MG tablet, 1 tablet (10 mg total) by G-tube route every six (6) hours as needed for nausea., Disp: 30 tablet, Rfl: 1  ???  senna (SENNA) 8.6 mg tablet, Take 1 tablet by mouth Two (2) times a day., Disp: 60 tablet, Rfl: 11  ???  senna (SENOKOT) 8.6 mg tablet, 2 tablets by G-tube route Two (2) times a day. Hold for diarrhea, Disp: 120 tablet, Rfl: 2  ???  sertraline (ZOLOFT) 50 MG tablet, 1 TABLET (50 MG TOTAL) BY G-TUBE ROUTE IN THE MORNING., Disp: 30 tablet, Rfl: 0  ???  gabapentin (NEURONTIN) 300 MG capsule, 1 capsule (300 mg  total) by G-tube route Three (3) times a day., Disp: 90 capsule, Rfl: 1  ???  levothyroxine (SYNTHROID) 150 MCG tablet, 1 tablet (150 mcg total) by G-tube route daily., Disp: 30 tablet, Rfl: 0  ???  mirtazapine (REMERON) 15 MG tablet, 1 tablet (15 mg total) by G-tube route nightly., Disp: 90 tablet, Rfl: 3    PMH:   Past Medical History:   Diagnosis Date   ??? Arthritis     bil  knees   ??? Cancer (CMS-HCC)     SCC   ??? CKD (chronic kidney disease), stage III (CMS-HCC)    ??? Depressive disorder    ??? Hypothyroidism 09/11/2015   ??? Insomnia 07/24/2014   ??? Mass of throat 2015    treated with chemo and radiation    ??? Psychiatric Medication Trials     mirtazapine 15mg  x 2 months   ??? Substance abuse (CMS-HCC)     alcohol use disorder during college   ??? Substance Use Treatment History     h/o attending AA during college years     Social History:  reports that he has been smoking cigars. He started smoking about 32 years ago. He has smoked for the past 15.00 years. He has never used smokeless tobacco. He reports that he does not drink alcohol and does not use drugs.   - Currently lives alone, although is receiving     Family Hx: Cancer-related family history includes Breast cancer (age of onset: 26) in his mother. There is no history of Cancer.    PE:  BP 177/102  - Pulse 96  - Temp 36.8 ??C (98.2 ??F) (Tympanic)  - Resp 20  - Ht 190.5 cm (6' 3)  - Wt 65.1 kg (143 lb 8 oz)  - SpO2 96%  - BMI 17.94 kg/m??    Gen: Intermittently responsive, mildly shaking  HEENT: Pinpoint pupils  ??  Lab Results:  Results for Walter Armstrong (MRN [id number]) as of 02/19/2021 20:30   Ref. Range 02/19/2021 09:06   WBC Latest Ref Range: 3.6 - 11.2 10*9/L 9.6   RBC Latest Ref Range: 4.26 - 5.60 10*12/L 3.48 (L)   HGB Latest Ref Range: 12.9 - 16.5 g/dL 9.9 (L)   HCT Latest Ref Range: 39.0 - 48.0 % 30.4 (L)   MCV Latest Ref Range: 77.6 - 95.7 fL 87.2   MCH Latest Ref Range: 25.9 - 32.4 pg 28.5   MCHC Latest Ref Range: 32.0 - 36.0 g/dL 62.1   RDW Latest Ref Range: 12.2 - 15.2 % 15.1   MPV Latest Ref Range: 6.8 - 10.7 fL 8.1   Platelet Latest Ref Range: 150 - 450 10*9/L 287   Neutrophils % Latest Units: % 68.5   Lymphocytes % Latest Units: % 14.4   Monocytes % Latest Units: % 14.4   Eosinophils % Latest Units: % 2.0   Basophils % Latest Units: % 0.7   Absolute Neutrophils Latest Ref Range: 1.8 - 7.8 10*9/L 6.6   Absolute Lymphocytes Latest Ref Range: 1.1 - 3.6 10*9/L 1.4   Absolute Monocytes  Latest Ref Range: 0.3 - 0.8 10*9/L 1.4 (H)   Absolute Eosinophils Latest Ref Range: 0.0 - 0.5 10*9/L 0.2 Absolute Basophils  Latest Ref Range: 0.0 - 0.1 10*9/L 0.1   Sodium Latest Ref Range: 135 - 145 mmol/L 129 (L)   Potassium Latest Ref Range: 3.4 - 4.8 mmol/L 4.7   Chloride Latest Ref Range: 98 - 107 mmol/L 93 (L)   CO2 Latest  Ref Range: 20.0 - 31.0 mmol/L 34.0 (H)   Bun Latest Ref Range: 9 - 23 mg/dL 43 (H)   Creatinine Latest Ref Range: 0.60 - 1.10 mg/dL 1.61 (H)   BUN/Creatinine Ratio Unknown 22   eGFR CKD-EPI (2021) Male Latest Ref Range: >=60 mL/min/1.80m2 38 (L)   Anion Gap Latest Ref Range: 5 - 14 mmol/L 2 (L)   Glucose Latest Ref Range: 70 - 179 mg/dL 096   Calcium Latest Ref Range: 8.7 - 10.4 mg/dL 9.4   Magnesium Latest Ref Range: 1.6 - 2.6 mg/dL 1.9   Albumin Latest Ref Range: 3.4 - 5.0 g/dL 3.0 (L)   Total Protein Latest Ref Range: 5.7 - 8.2 g/dL 8.7 (H)   Total Bilirubin Latest Ref Range: 0.3 - 1.2 mg/dL 0.3   AST Latest Ref Range: <=34 U/L 22   ALT Latest Ref Range: 10 - 49 U/L 22   Alkaline Phosphatase Latest Ref Range: 46 - 116 U/L 107       RADIOLOGY:    CT Chest 12/25/20:  IMPRESSION:  Findings consistent with bilateral recurrent aspiration as discussed, left greater than right.    CT Neck 12/25/20:  IMPRESSION:  - Redemonstrated sinus tract extending from the hypopharynx to the left supraclavicular subcutaneous soft tissues with persistent peripherally enhancing fluid collection is region.  - Greater conspicuity of nodular mucosal thickening and enhancement along the right lateral aspect of the hypopharynx, which may be due to differences in scan quality and contrast timing, however correlation with direct visualization is recommended as progressive tumor cannot be excluded.  - Redemonstrated adjacent soft tissue with at least 180 degree abutment of the left common carotid artery producing mild luminal narrowing.  - Similar lytic lesions of the C5 and C6 vertebral bodies.  - New superior endplate compression fracture of C7.  - Please refer to dedicated same day CT chest for further evaluation/characterization of structures below the thoracic inlet.    PATHOLOGY:    PD-L1 04V4 FDA Rande Lawman) for HNSCC (Head and Neck): EXPRESSED  Combined Positive Score: 10    Reference Range:  PD-L1 Expression:  CPS >/=1  No PD-L1 Expression:  CPS <1    For methodology and other details, see the original [facility] report (Media tab).   Addendum electronically signed by Alyce Pagan, MD on 11/05/2020 at 1052  Addendum  The tumor is negative for HPV 16/18 by ISH study (controls stain appropriately). The diagnoses are unchanged.  Addendum electronically signed by Alyce Pagan, MD on 10/05/2020 at [phone]  Final Diagnosis  A: Oropharynx, post cricoid lesion, biopsy  - Invasive squamous cell carcinoma, moderately differentiated, keratinizing  - The tumor is negative for p16 by IHC stain; HPV 16/18 ISH is pending and results will be reported in an addendum    Assessment/Plan: Walter Armstrong is a 65 y.o. man with h/o heavy tobacco and alcohol use,??prior T4N2bM0 SCC of the oropharynx (HPV, p16 negative) s/p [id number] induction study (induction chemo followed by CRT) in 2014, then new primary SCC, not an operative candidate due to gross tumor extension into prevertebral fascia and bilateral carotid sheaths. He is s/p awake tracheostomy on 3/4 after presenting with respiratory distress and stridor secondary to diffuse lateral and posterior pharyngeal wall edema with limited vocal fold mobility.  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      When responsive today, the patient was able to join discussion, but the majority of this visit was spent addressing what appears to be too much opioids.  Given palliative GOC, we tried monitoring him in the clinic to avoid the ER and to avoid narcan.  He did improve slowly, but was not appropriate for safe discharge at the time of clinic closing and was therefor transferred to ER.  When he is more cogent, I plan to finalize GOC discussion, with an eye towards hospice, if c/w patient wishes.  If he should not wish this, will need to taper opioids some to get to better PS and mental status before assessing appropriateness of further cytotoxics.    I personally spent >60 minutes face-to-face and non-face-to-face in the care of this patient, which includes all pre, intra, and post visit time on the date of service.

## 2021-02-19 NOTE — Unmapped (Signed)
Pt brought to ED from multidisciplinary clinic, code medic called by clinic due to possible opioid overdose. Pt found to be drowsy, pinpoint pupils, new oxygen requirement of 4L. Fell in clinic. Clinic concerned for overdose, pt originally supposed to go home with hospice care today. Hypertensive at 174/114 in clinic. Pt arousable by voice command, able to ambulate to stretcher with assistance. Oriented x4. Mucous noted from trach.

## 2021-02-19 NOTE — Unmapped (Signed)
Port accessed by [employer], labs drawn and sent.

## 2021-02-19 NOTE — Unmapped (Addendum)
OUTPATIENT ONCOLOGY PALLIATIVE CARE    Principal supportive care diagnosis: [name] is a 65 y.o. 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    Assessment/Plan:   1.  Somnolence-probably due to excess opioid use.  Requiring oxygen 4 L due to initial stats 85% room air with decreased respiration rate.  Able to arouse with gentle touch to knee.  Sats improved to 100% with oxygen and encouragement to take deep breaths.  Able to engage in conversation briefly and then drifts off.  Was able to ambulate to the restroom independently at 12:30 and caring oxygen container and later this afternoon was found on the floor.  Does have a history of opioid use disorder.  Pain had been managed well in the last month with no early refills and appropriately using opioids and urine tox confirmations.  [name] had been living independently and Walter Armstrong checks on him daily.  This morning Walter Armstrong-brother did notice more sedation.  -Discussed treatment options and decided against giving Narcan due to intense pain that would accompany this.  -Patient has completed the advance care planning document several weeks ago which is in epic stating that primary goal is to try to have pain adequately controlled.  -Initial plan with nursing support was to keep patient here until 5 PM in the clinic and then Walter Armstrong will pick [name] when he is done working.  Finding that this plan will not work and spoke with Walter Armstrong charge RN ED and explained the situation.  -NP spoke to Walter Armstrong and shared that plan is to send [name] to the ED.  Shared that the goal is to try not to give the Narcan, but he may need this.  Walter Armstrong is aware that this may happen.  He is planning on coming to the ED to be with his brother.  -Plan is to discharge home with Walter Armstrong with Armstrong support- Walter Armstrong with [facility] (see ACP as well).  Walter Armstrong shares that he can help with the opioid management.  -Decrease xtampza 18 mg twice daily to xtampza 9 mg twice daily via G-tube and to start when pt is awake.   -Decrease oxycodone 10 mg every 3 hours with a maximum 7 tablets today to every 4 hours with a maximum of 5 tablets/day as needed and Walter Armstrong to administer pain medications.  -NP gave Walter Armstrong the oxycodone IR bottle (pill check not done, but looks like 165 tabs in bottle) and the xtampza 18 mg tabs are in the pt's black medication bag.     2. Pain-anterior and posterior neck due to SCC recurrence and fracture in C7-controlled well with current regimen. Seen by Rad onc-Not a candidate for radiation due to potential side effects.  -See above for pain medication dosing.       3.  Potential for opioid-induced constipation-  -Continue senokot bid  -Continue miralax bid    4. Wgt loss-plan was to consult with Walter Armstrong.  Receives all of his medications except for the oxycodone which is sublingually via the G-tube.      5. Depression/Anxiety-not able to assess due to to somnolence.  -Continue mirtazapine 15 mg at at bedtime.  -Continue Zoloft 50 mg via G-tube  -Will not restart rexulti.      6.  Goals of care/advance care planning  -Agreed that [name] of trying to maintain his quality  of life and adequate pain control.  Receptive to [name] and not receiving chemotherapy.  -[name] able to share today that he values the ability to live at home by himself.  -Walter Lai RN palliative care team confirmed that Walter Armstrong with [facility] will coordinate with the ED case manager for transfer of care to their Armstrong.  Initial order was to Walter Armstrong Armstrong in [place], but they were not firm on commitment to see patient tomorrow changed to Walter Armstrong Armstrong for better care coordination.      -At prior visits: Goal is to live as long as he possibly can and also not to experience much suffering and would like his quality of life to be high.  The prepare for your care document is completed and in epic.   -Shared that his fear is that he will be alone as he approaches end-of-life care.  -NP began a discussion of [name] and their services.    -Visit on 01/17/21-Dr. Janann August offering options of care. Believes he has a different cancer than the 1st. Options are supportive care, to restart with prior chemo-discussed intensities of chemo.   -Discussed neck fistula and chemo will not improve this.   -Hypopharynx spot grew with a little bit of chemo that he had been able to get.   -It does get chemo recommend: 5-FU with pump, carbo and pembro. Probability of years of control is less than 5-10%. It is a more difficult regimen to tolerate.  -Not for curative intent.  -QOL can occur with this chemo regimen.  -More concerned that the chemo can by harmful.     -At prior visits: Would like to eat again.  Joint visit with Dr. Janann August, Dr. Darlin Coco, Adela Lank Dela Pe??a, PharmD and myself.  Clearly shared risk and benefits of continued eating and drinking.  Another goal is to be able to spend more time with his 2 grandchildren.  Discussed options of supportive care and not receiving chemotherapy and continuing with chemotherapy.  [name] understands that the chemotherapy is for palliation and not curative intent.  -Confirmed DNR/DNI status.    Walter Armstrong'S CELL NUMBER IS [phone]  Health Care Decision Maker as of  HCDM [facility]): Walter Armstrong [phone]    HCDM (patient stated preference): Walter Armstrong - Mother - [phone]    8. Controlled substances risk management.   - Patient has a signed pain medication agreement with Outpatient Palliative Care, completed on previous date, as per standard of care and signed again on 12/25/20   - [place]CC[place] database was reviewed today and it was appropriate.   - Urine drug screen was NOT performed at this visit.  Past urine tox and opioid confirmation test have been appropriate.   - Patient has received information about safe storage and administration of medications.   - Patient has received a prescription for narcan; reviewed again Narcan use at home with Walter Armstrong.  Walter Armstrong is unsure if there is Narcan in the home.  NP explained that potentially [name] could die of an overdose given what happened today if his opioids are not managed more closely.  Walter Armstrong is aware.    F/u: Discharge to Armstrong  ----------------------------------------    HPI: 65 y.o.??male??with a PMHx of CKD3, MDD, hypothyroid and oropharyngeal SCC s/p induction chemo and CRT??2014 and new primary neck SCC s/p tracheostomy (11/02/20) and g-tube. On CbP/Tax/Pembro??that presented to [facility]with abdominal pain and found to have constipation, neck fistulas, and GNR bacteremia hospitalized from 4/6-4/14. ENT was consulted and  stated that he is not a surgical candidate and that he should not be taking anything by mouth as he has fistulas in his neck. SLP was also in agreement with his aspiration and infectious risk with his neck fistulas.    CT scans 12/25/20 showing bilateral recurrent aspiration as discussed, left greater than right and Redemonstrated sinus tract extending from the hypopharynx to the left supraclavicular subcutaneous soft tissues with persistent peripherally enhancing fluid collection is region. Redemonstrated adjacent soft tissue with at least 180 degree abutment of the left common carotid artery producing mild luminal narrowing. Similar lytic lesions of the C5 and C6 vertebral bodies. New superior endplate compression fracture of C7.            Primary symptom is anterior and posterior neck pain.  Feels that the pain is worsening in the posterior region.  When he takes the oxycodone 10 mg sublingually 6 times a day his pain does go down to 3-4/10, but pain relief does not last as long as he like it to.  Shares that it is frustrating having the tracheostomy and the neck pain.  He is worried about getting his opioid medications refilled.  Shares that he has been eating some eggs, potatoes and Pepsi.  Worried about the continued right foot swelling x1 month.  Endorsing some weakness but still overall able to live on his own.  Takes care of his own trach dressing and G-tube dressing.  Denying shortness of breath.  Does endorse feeling sad.  Does the bolus feeding tube by himself.  His bowel movements have been every other day and he takes the Senokot tablets orally.  Has been taking all of his medications orally.      Interval history 02/05/2021 CK, [name] and brother Walter Armstrong    -Pain remains controlled with current regimen of xtampza 18 mg twice daily and oxycodone 10 mg tabs 7/day  -Using 6 cans a G-tube per day and is losing weight.  -Endorsing some constipation.  Not using MiraLAX and Senokot on a regular basis.  -Mood is slightly better, but still worries about the future and has sadness.  -Confirms he is using the pillbox, but he brought in his medications and it still had medications that I had discontinued from 2 weeks ago.    Interval hx 02/19/21 CK, Mr. Konrad Dolores and Dr. Janann August.    -Denies pain.  -Only took 1 can of tube feed this morning prior to coming to [facility].  -Has been living independently at home with his brother Walter Armstrong checking in on him on a daily basis.  -Have been having issues with med compliance and filling his pillbox.  -Denies constipation.  -Walter Armstrong did appreciate some sedation this morning.  [name] was able to walk from the car to [facility] clinic.      Social:  Lives alone in [place], [place]. Visits with his  85 yo granddaughter Walter Armstrong every other day and his 2 year grandson. At initial PC visit:  His Grand dtr is a major source of inspiration for him -- he wants to be healthy so that he can continue to care for her as she grows up.  He spends his extra money on toys and clothes for her, buys her dresses to wear to church and toys for her to keep at his house.   government.  He is currently receiving disability.  His brother lives about 20 minutes and his son lives about 5 minutes away.  He does have a  son and a daughter.  He is active with his church community. His strengths include his spiritual devotion-has an active prayer life.  His brother Walter Armstrong and Walter Armstrong sister-in-law are highly supportive and they have health issues of their own.  Walter Armstrong sharing on the 02/19/2021 visit that his wife is in a skilled nursing facility due to health issues of her own.    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    Walter Armstrong and Walter Armstrong his brother and sister-in-law are his primary caregivers.  He has a friend Walter Armstrong who checks on him every day.    Objective      Allergies: No Known Allergies     Current Outpatient Medications   Medication Sig Dispense Refill   ??? oxyCODONE (ROXICODONE) 10 mg immediate release tablet Take 1 tab (10 mg) under tongue every 3 hours as needed for pain with a Max 7 tabs/day. (Patient taking differently: Take 10 mg by mouth every four (4) hours as needed. Take 1 tab (10 mg) under tongue every 4hours as needed for pain with a Max 5 tabs/day. DOSE CHANGE BY CK ON 02/19/21) 210 tablet 0   ??? diclofenac sodium (VOLTAREN) 1 % gel      ??? levothyroxine (SYNTHROID) 150 MCG tablet 1 tablet (150 mcg total) by G-tube route in the morning. 30 tablet 0   ??? mirtazapine (REMERON) 15 MG tablet 1 tablet (15 mg total) by G-tube route nightly. 90 tablet 3   ??? naloxone (NARCAN) 4 mg nasal spray One spray in either nostril once for known/suspected opioid overdose. May repeat every 2-3 minutes in alternating nostril til EMS arrives 2 each 0   ??? ondansetron (ZOFRAN-ODT) 8 MG disintegrating tablet Take 1 tablet (8 mg total) by mouth every eight (8) hours as needed for nausea. place tablet on tongue and allow to dissolve. Swallow with saliva (no need to administer with liquids). 30 tablet 1   ??? oxyCODONE myristate (XTAMPZA ER) 9 mg CSpT Take 9 mg by mouth every twelve (12) hours. X 14 days. 28 each 0   ??? polyethylene glycol (MIRALAX) 17 gram/dose powder 17 g by G-tube route two (2) times a day as needed (constipation). 850 g 3   ??? prochlorperazine (COMPAZINE) 10 MG tablet 1 tablet (10 mg total) by G-tube route every six (6) hours as needed for nausea. 30 tablet 1   ??? senna (SENNA) 8.6 mg tablet Take 1 tablet by mouth Two (2) times a day. 60 tablet 11   ??? senna (SENOKOT) 8.6 mg tablet 2 tablets by G-tube route Two (2) times a day. Hold for diarrhea 120 tablet 2   ??? sertraline (ZOLOFT) 50 MG tablet 1 tablet (50 mg total) by G-tube route in the morning. 30 tablet 0     No current facility-administered medications for this visit.       Past Medical History:   Past Medical History:   Diagnosis Date   ??? Arthritis     bil  knees   ??? Cancer (CMS-HCC)     SCC   ??? CKD (chronic kidney disease), stage III (CMS-HCC)    ??? Depressive disorder    ??? Hypothyroidism 09/11/2015   ??? Insomnia 07/24/2014   ??? Mass of throat 2015    treated with chemo and radiation    ??? Psychiatric Medication Trials     mirtazapine 15mg  x 2 months   ??? Substance abuse (CMS-HCC)     alcohol use disorder during college   ???  Substance Use Treatment History     h/o attending AA during college years     Personal and Social History:  Social History     Social History Narrative    Social Hx:    Relationship: divorced; was married x 10 y; No relationship; alot of lifetime partners all male, condoms some of the time        Kids: age 95 and 91; both doing well and live close by    Living situation: lives w/ his brother and brother's wife in [place], where he grew up.    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; last worked in 10/2012; currently on short term disability    Education: Licensed conveyancer for college    Social support: brother somewhat supportive; pt describes family as having limited understanding of survivorship issues; pt also reports sensing support from kids, church    Spiritual: attends church twice weekly though less engaged than previously    Presenter, broadcasting: enjoyed working in yard, helping around house when he was feeling better; follows college Engineer, production for [employer]: traveled to [place], [place], [place], [place], no [place] or [place] or [place]     Family History:  Family History   Problem Relation Age of Onset   ??? Breast cancer Mother 48   ??? Hypertension Father    ??? Seizures Sister    ??? Tuberculosis Other         stepfather had pulmonary tb and treated when patient lived with him as kid   ??? No Known Problems Brother    ??? No Known Problems Maternal Aunt    ??? No Known Problems Maternal Uncle    ??? No Known Problems Paternal Aunt    ??? No Known Problems Paternal Uncle    ??? No Known Problems Maternal Grandmother    ??? No Known Problems Maternal Grandfather    ??? No Known Problems Paternal Grandmother    ??? No Known Problems Paternal Grandfather    ??? Drug abuse Neg Hx    ??? Depression Neg Hx    ??? Bipolar disorder Neg Hx    ??? Anxiety disorder Neg Hx    ??? Alcohol abuse Neg Hx    ??? Schizophrenia Neg Hx    ??? Physical abuse Neg Hx    ??? Anesthesia problems Neg Hx    ??? Broken bones Neg Hx    ??? Cancer Neg Hx    ??? Clotting disorder Neg Hx    ??? Collagen disease Neg Hx    ??? Diabetes Neg Hx    ??? Dislocations Neg Hx    ??? Fibromyalgia Neg Hx    ??? Gout Neg Hx    ??? Hemophilia Neg Hx    ??? Osteoporosis Neg Hx    ??? Rheumatologic disease Neg Hx    ??? Scoliosis Neg Hx    ??? Severe sprains Neg Hx    ??? Sickle cell anemia Neg Hx    ??? Spinal Compression Fracture Neg Hx      REVIEW OF SYSTEMS:  A comprehensive review of 10 systems was negative except for pertinent positives noted in HPI.    Palliative Performance Scale: 70% - Ambulation: Reduced / unable to do normal work, some evidence of disease / Self-Care: Full / Intake: Normal or reduced / Level of Conscious: Full     Opioid Risk Tool:    Male  Male    Family history of substance abuse  Alcohol  1  3    Illegal drugs  2  3    Rx drugs  4  4    Personal history of substance abuse      Alcohol  3  3    Illegal drugs  4  4    Rx drugs  5  5    Age between 16--45 years  1  1    History of preadolescent sexual abuse  3  0 Psychological disease      ADD, OCD, bipolar, schizophrenia  2  2    Depression  1  1      Total: 9  (<3 low risk, 4-7 moderate risk, >8 high risk)    Physical Exam:    Wt Readings from Last 6 Encounters:   02/19/21 65.1 kg (143 lb 8 oz)   02/05/21 63.8 kg (140 lb 9.6 oz)   01/23/21 65.7 kg (144 lb 14.4 oz)   01/15/21 67.4 kg (148 lb 11.2 oz)   01/15/21 67.1 kg (148 lb)   12/31/20 68 kg (150 lb)       GEN: Somnolent, but arousable if stimulated and then drifts back off to sleep.  HEENT: Pin[place]  LUNGS: Decreased respiration rate  SKIN: No rashes, petechiae or jaundice noted on visible skin  EXT: No edema noted of the lower extremities  NEURO: Did observe walking and able to do without assistance.       Lab Results   Component Value Date    CREATININE 1.92 (H) 02/19/2021     Lab Results   Component Value Date    ALKPHOS 107 02/19/2021    BILITOT 0.3 02/19/2021    BILIDIR 0.20 05/28/2017    PROT 8.7 (H) 02/19/2021    ALBUMIN 3.0 (L) 02/19/2021    ALT 22 02/19/2021    AST 22 02/19/2021          Burtis Junes, FNP-BC, [facility]  Outpatient Oncology Palliative Care Service  [facility]  [address] [place], [place] [address]  [phone]      Time spent with patient was 75 minutes.  Additional 20 minutes were spent on preparation, documenting and coordinating care.

## 2021-02-20 LAB — CBC W/ AUTO DIFF
BASOPHILS ABSOLUTE COUNT: 0.1 10*9/L (ref 0.0–0.1)
BASOPHILS RELATIVE PERCENT: 1.3 %
EOSINOPHILS ABSOLUTE COUNT: 0.2 10*9/L (ref 0.0–0.5)
EOSINOPHILS RELATIVE PERCENT: 2 %
HEMATOCRIT: 30.1 % — ABNORMAL LOW (ref 39.0–48.0)
HEMOGLOBIN: 9.9 g/dL — ABNORMAL LOW (ref 12.9–16.5)
LYMPHOCYTES ABSOLUTE COUNT: 1.5 10*9/L (ref 1.1–3.6)
LYMPHOCYTES RELATIVE PERCENT: 18.3 %
MEAN CORPUSCULAR HEMOGLOBIN CONC: 32.7 g/dL (ref 32.0–36.0)
MEAN CORPUSCULAR HEMOGLOBIN: 28.2 pg (ref 25.9–32.4)
MEAN CORPUSCULAR VOLUME: 86.3 fL (ref 77.6–95.7)
MEAN PLATELET VOLUME: 7.8 fL (ref 6.8–10.7)
MONOCYTES ABSOLUTE COUNT: 0.7 10*9/L (ref 0.3–0.8)
MONOCYTES RELATIVE PERCENT: 9.2 %
NEUTROPHILS ABSOLUTE COUNT: 5.6 10*9/L (ref 1.8–7.8)
NEUTROPHILS RELATIVE PERCENT: 69.2 %
PLATELET COUNT: 298 10*9/L (ref 150–450)
RED BLOOD CELL COUNT: 3.49 10*12/L — ABNORMAL LOW (ref 4.26–5.60)
RED CELL DISTRIBUTION WIDTH: 15 % (ref 12.2–15.2)
WBC ADJUSTED: 8.1 10*9/L (ref 3.6–11.2)

## 2021-02-20 LAB — BASIC METABOLIC PANEL
ANION GAP: 2 mmol/L — ABNORMAL LOW (ref 5–14)
BLOOD UREA NITROGEN: 33 mg/dL — ABNORMAL HIGH (ref 9–23)
BUN / CREAT RATIO: 18
CALCIUM: 9.3 mg/dL (ref 8.7–10.4)
CHLORIDE: 99 mmol/L (ref 98–107)
CO2: 34 mmol/L — ABNORMAL HIGH (ref 20.0–31.0)
CREATININE: 1.8 mg/dL — ABNORMAL HIGH
EGFR CKD-EPI (2021) MALE: 42 mL/min/{1.73_m2} — ABNORMAL LOW (ref >=60)
GLUCOSE RANDOM: 74 mg/dL (ref 70–179)
POTASSIUM: 5 mmol/L — ABNORMAL HIGH (ref 3.4–4.8)
SODIUM: 135 mmol/L (ref 135–145)

## 2021-02-20 LAB — MAGNESIUM: MAGNESIUM: 1.9 mg/dL (ref 1.6–2.6)

## 2021-02-20 LAB — PHOSPHORUS: PHOSPHORUS: 4.5 mg/dL (ref 2.4–5.1)

## 2021-02-20 MED ORDER — OXYCODONE 10 MG TABLET
ORAL_TABLET | ORAL | 0 refills | 0.00000 days | Status: CN | PRN
Start: 2021-02-20 — End: ?

## 2021-02-20 MED ORDER — MIRTAZAPINE 15 MG TABLET
ORAL_TABLET | Freq: Every evening | GASTROSTOMY | 0 refills | 30 days | Status: CP
Start: 2021-02-20 — End: 2021-03-22

## 2021-02-20 MED ORDER — AMOXICILLIN 400 MG-POTASSIUM CLAVULANATE 57 MG/5 ML ORAL SUSPENSION
Freq: Two times a day (BID) | GASTROSTOMY | 0 refills | 5.00000 days | Status: CP
Start: 2021-02-20 — End: 2021-02-20

## 2021-02-20 MED ORDER — AMOXICILLIN 875 MG-POTASSIUM CLAVULANATE 125 MG TABLET
ORAL_TABLET | Freq: Two times a day (BID) | GASTROSTOMY | 0 refills | 5 days | Status: CN
Start: 2021-02-20 — End: 2021-02-25

## 2021-02-20 MED ADMIN — ampicillin-sulbactam (UNASYN) 3 g in sodium chloride 0.9 % (NS) 100 mL IVPB connector bag: 3 g | INTRAVENOUS | @ 01:00:00 | Stop: 2021-02-24

## 2021-02-20 MED ADMIN — enoxaparin (LOVENOX) syringe 40 mg: 40 mg | SUBCUTANEOUS | @ 12:00:00 | Stop: 2021-02-20

## 2021-02-20 MED ADMIN — ampicillin-sulbactam (UNASYN) 3 g in sodium chloride 0.9 % (NS) 100 mL IVPB connector bag: 3 g | INTRAVENOUS | @ 12:00:00 | Stop: 2021-02-20

## 2021-02-20 MED ADMIN — levothyroxine (SYNTHROID) tablet 150 mcg: 150 ug | GASTROENTERAL | @ 09:00:00 | Stop: 2021-02-20

## 2021-02-20 MED ADMIN — lactated ringers bolus 1,000 mL: 1000 mL | INTRAVENOUS | @ 12:00:00 | Stop: 2021-02-20

## 2021-02-20 MED ADMIN — ampicillin-sulbactam (UNASYN) 3 g in sodium chloride 0.9 % (NS) 100 mL IVPB connector bag: 3 g | INTRAVENOUS | @ 06:00:00 | Stop: 2021-02-20

## 2021-02-20 MED ADMIN — amoxicillin-clavulanate (AUGMENTIN) 875-125 mg per tablet 1 tablet: 875 mg | GASTROENTERAL | @ 17:00:00 | Stop: 2021-02-20

## 2021-02-20 MED ADMIN — sertraline (ZOLOFT) tablet 50 mg: 50 mg | GASTROENTERAL | @ 12:00:00 | Stop: 2021-02-20

## 2021-02-20 NOTE — Unmapped (Signed)
Care Management  Initial Transition Planning Assessment              General  Care Manager assessed the patient by : In person interview with patient, Medical record review, Discussion with Clinical Care team, Telephone conversation with family  Orientation Level: Oriented X4  Functional level prior to admission: Independent  Reason for referral: Discharge Planning, Transportation, Hospice, Home Health, Durable Medical Equipment, Placement, Psychosocial/Community Resource Concerns    Contact/Decision Maker  Extended Emergency Contact Information  Primary Emergency Contact: Jackqulyn [place]  Home Phone: [phone]  Mobile Phone: [phone]  Relation: Brother  Secondary Emergency Contact: [facility] Phone: [phone]  Relation: Mother    Legal Next of Kin / Guardian / POA / Advance Directives     HCDM (HCPOA): Theressa Stamps [phone]    HCDM (patient stated preference): Bertrum Sol - Mother - [phone]    Advance Directive (Medical Treatment)  Does patient have an advance directive covering medical treatment?: Patient has advance directive covering medical treatment, copy in chart.    Health Care Decision Maker [HCDM] (Medical & Mental Health Treatment)  Healthcare Decision Maker: HCDM documented in the HCDM/Contact Info section.  Information offered on HCDM, Medical & Mental Health advance directives:: Patient given information.    Advance Directive (Mental Health Treatment)  Does patient have an advance directive covering mental health treatment?: Patient does not have advance directive covering mental health treatment.  Reason patient does not have an advance directive covering mental health treatment:: HCDM documented in the HCDM/Contact Info section.    Patient Information  Lives with: Alone    Type of Residence: Private residence        Location/Detail: [address], [place] [address].    Support Systems/Concerns: Concern for Caregiver Burnout, Significant Other    Responsibilities/Dependents at home?: No    Home Care services in place prior to admission?: Yes  Type of Home Care services in place prior to admission: Hospice  Current Home Care provider (Name/Phone #): [facility]    Outpatient/Community Resources in place prior to admission: Clinic  Agency detail (Name/Phone #): [email or web address] Marzetta Board, MD. ADD: [facility] Internal [address] [place], [place], [address].    Equipment Currently Used at Home: none  Current HME Agency (Name/Phone #): N/A    Currently receiving outpatient dialysis?: No       Financial Information       Need for financial assistance?: No       Social Determinants of Health  Social Determinants of Health were addressed in provider documentation.  Please refer to patient history.  Social Determinants of Health     Tobacco Use: High Risk   ??? Smoking Tobacco Use: Current Every Day Smoker   ??? Smokeless Tobacco Use: Never Used   Alcohol Use: Not on file   Financial Resource Strain: Medium Risk   ??? Difficulty of Paying Living Expenses: Somewhat hard   Food Insecurity: No Food Insecurity   ??? Worried About Running Out of Food in the Last Year: Never true   ??? Ran Out of Food in the Last Year: Never true   Transportation Needs: Unmet Transportation Needs   ??? Lack of Transportation (Medical): Yes   ??? Lack of Transportation (Non-Medical): No   Physical Activity: Not on file   Stress: Not on file   Social Connections: Not on file   Intimate Partner Violence: Not on file   Depression: Not on file   Housing/Utilities:  Low Risk    ??? Within the past 12 months, have you ever stayed: outside, in a car, in a tent, in an overnight shelter, or temporarily in someone else's home (i.e. couch-surfing)?: No   ??? Are you worried about losing your housing?: No   ??? Within the past 12 months, have you been unable to get utilities (heat, electricity) when it was really needed?: No   Substance Use: Not on file   Health Literacy: High Risk   ??? : Always Discharge Needs Assessment  Concerns to be Addressed: discharge planning, cognitive/perceptual, grief and loss, home safety    Clinical Risk Factors: Functional Limitations, History of Falls, Principal Diagnosis: Cancer, Stroke, COPD, Heart Failure, AMI, Pneumonia, Joint Replacment, Poor Health Literacy, Multiple Diagnoses (Chronic), Lives Alone or Absence of Caregiver to Assist with Discharge and Home Care, Palliative Care: Does this patient have an advanced or progressive serious illness?    Barriers to taking medications: No    Prior overnight hospital stay or ED visit in last 90 days: No    Readmission Within the Last 30 Days: no previous admission in last 30 days    Patient's Choice of Community Agency(s): N/A    Anticipated Changes Related to Illness: inability to care for self    Equipment Needed After Discharge: other (see comments) (TBD)    Discharge Facility/Level of Care Needs: other (see comments) (SNF Hospice vs. Inpt Hospice vs. Home Hospice (if anough caregiver support))    Readmission  Risk of Unplanned Readmission Score:  %  Predictive Model Details   No score data available for [facility] Risk of Unplanned Readmission     Readmitted Within the Last 30 Days? (No if blank)   Patient at risk for readmission?: Yes    Discharge Plan  Screen findings are: Discharge planning needs identified or anticipated (Comment).    Expected Discharge Date:     Expected Transfer from Critical Care:  (N/A)    Quality data for continuing care services shared with patient and/or representative?: N/A  Patient and/or family were provided with choice of facilities / services that are available and appropriate to meet post hospital care needs?: Yes   List choices in order highest to lowest preferred, if applicable. : Hospice    Initial Assessment complete?: Yes

## 2021-02-20 NOTE — Unmapped (Signed)
Received sign out from previous provider.    Patient Summary: Walter Armstrong is a 65 y.o. 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  Patient unfortunately does not have resources for home hospice or oxygen at home.  Action List:   Discussion with MDO with regards to admitting patient.    Updates  ED Course as of 02/19/21 2153   Tue Feb 19, 2021   2009 Spoke to team, patient to be admitted.

## 2021-02-20 NOTE — Unmapped (Signed)
Adult Nutrition Assessment Note    Visit Type: MD Consult  Reason for Visit: Assessment (Nutrition)      HPI & PMH:  Walter Armstrong is a 65 y.o. 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   Past Medical History:   Diagnosis Date   ??? Arthritis     bil  knees   ??? Cancer (CMS-HCC)     SCC   ??? CKD (chronic kidney disease), stage III (CMS-HCC)    ??? Depressive disorder    ??? Hypothyroidism 09/11/2015   ??? Insomnia 07/24/2014   ??? Mass of throat 2015    treated with chemo and radiation    ??? Psychiatric Medication Trials     mirtazapine 15mg  x 2 months   ??? Substance abuse (CMS-HCC)     alcohol use disorder during college   ??? Substance Use Treatment History     h/o attending AA during college years         Anthropometric Data:  Height:   190.5cm  Admission weight:  65.1kg  Last recorded weight:  65.1kg  IBW:  89kg  Percent IBW:  72%  BMI: 17  Usual Body Weight: no usual weight status    Weight history prior to admission: 5.9% increase weight since 12/07/20   Wt Readings from Last 10 Encounters:   02/19/21 65.1 kg (143 lb 8 oz)   02/05/21 63.8 kg (140 lb 9.6 oz)   01/23/21 65.7 kg (144 lb 14.4 oz)   01/15/21 67.4 kg (148 lb 11.2 oz)   01/15/21 67.1 kg (148 lb)   12/31/20 68 kg (150 lb)   12/25/20 65.7 kg (144 lb 14.4 oz)   12/07/20 61.4 kg (135 lb 5.8 oz)   12/03/20 61.3 kg (135 lb 2.3 oz)   12/03/20 61.3 kg (135 lb 3.2 oz)        Weight changes this admission: There were no vitals filed for this visit.     Nutrition Focused Physical Exam:             Nutrition Evaluation  Overall Impressions: Nutrition-Focused Physical Exam not indicated due to lack of malnutrition risk factors. (02/20/21 0981)  Nutrition Designation: Underweight (BMI < 18.50  kg/m2) (02/20/21 0849)         NUTRITIONALLY RELEVANT DATA     Medications:   Nutritionally pertinent medications reviewed and evaluated for potential food and/or medication interactions.     Labs:   Nutritionally pertinent labs reviewed.     Nutrition History:   February 20, 2021: Prior to admission: Pt reports had been tolerating 6 cans of nepro formula daily with FWF 60mL before and after each feeding. He denies any intolerances lately.    Allergies, Intolerances, Sensitivities, and/or Cultural/Religious Dietary Restrictions: none identified at this time     Current Nutrition:  No diet order        Nutrition Orders   (From admission, onward)            None             Nutritional Needs:   Energy: 2275-2600 kcals 35-40 kcal/kg using admission body weight, 65 kg (02/20/21 0729)]  Protein: 98 gm [1.2-1.5 gm/kg using admission body weight, 65 kg (02/20/21 0729)]  Carbohydrate:   [no restriction]  Fluid: 1625 mL [25 mL/kg]  Malnutrition Assessment using AND/ASPEN Clinical Characteristics:    Patient does not meet AND/ASPEN criteria for malnutrition at this time (02/20/21 0849)       GOALS and EVALUATION     ??? Patient to meet 85% or greater of nutritional needs via enteral nutrition within 1-2 days. - New   ??? No s/s of aspiration-New    Motivation, Barriers, and Compliance:  Evaluation of motivation, barriers, and compliance pending at this time due to clinical status.     NUTRITION ASSESSMENT     ??? Renal labs higher, Pt with CKD history. Has at home been on renal TF formula  ??? Pt noted concern for aspiration and may benefit cyclic TF's when safe to restart  ??? Weight hx, Pt with increased weight status. No observed signs of swelling or noted edema      Discharge Planning:   Monitor via CAPP rounds for any discharge planning needs.      NUTRITION INTERVENTIONS and RECOMMENDATION     1. Can consider cyclic feedings via TF pump when able restart TF's: 26mL/hr over 16hrs with FWF q 4 hrs ( formula, 2448 kcal, 110g prot, FW)  2. Keep HOB elevated during feeds  3. Weigh once weekly    Follow-Up Parameters:   1-2 times per week (and more frequent as indicated)    Ed Blalock, MS, RD, CSO, LDN  Pager # [phone]

## 2021-02-20 NOTE — Unmapped (Signed)
Oncology (MEDO) History & Physical    Assessment & Plan:   Walter Armstrong is a 65 y.o. 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     Principal Problem:    Somnolence  Active Problems:    Oropharynx cancer (CMS-HCC)    Chronic kidney disease, stage III (moderate) (CMS-HCC)    Depression    Malnutrition (CMS-HCC)    Chronic pain    Hypothyroidism (RAF-HCC)    Hypoxia    Tracheostomy dependence (CMS-HCC)    PEG (percutaneous endoscopic gastrostomy) status (CMS-HCC)    SCC (squamous cell carcinoma), scalp/neck  Resolved Problems:    * No resolved hospital problems. *    AHRF - AMS - C/f aspiration pneumonia  Code medic called during patient's appointment today when he was somnolent and satting 85% on room air w/ a decreased respiratory rate. Concern patient's opioid use for his cancer pain is the cause of his somnolence. Patient may have aspirated on his tube feeds as well due to his AMS given thick brown liquid draining from his trach (>40 ml suctioned). On exam patient was satting 92% on HFTC at 35% FiO2. CXR w/ bibasilar (L>R) opacities and L midlung opacities c/f aspiration/pneumonia.  - Spoke with patient and HCDM, Walter Armstrong, who agree w/ wanting patient to be admitted and receiving abx  - Ordered IV Unasyn for presumed aspiration pneumonia  - LRCx pending  - BCx pending    Neck SCC s/p tracheostomy  Follows with Walter Armstrong.??Had a history of oropharynx SCC treated with chemo and radiation therapy in 2014 and now with a new primary SCC. S/p tracheostomy on 3/4 due to respiratory distress and stridor from pharyngeal wall edema. Per ENT, he is not a surgical candidate due to gross tumor extension into prevertebral fascia and bilateral carotid sheaths. He was started on pembrolizumab/paclitaxel/carboplatin and received C2D1(12/03/20) but unfortunately was admitted to hospital soon after for stercoral colitis and GNR bacteremia. Ongoing GOC discussions (see note from palliative care on 6/21). Initial plan was for home with hospice with friend/neighbor (though patient refers to him as his brother), Walter Armstrong; however, per ED, this can no longer happen as Walter Armstrong was not planning on patient staying in his home. Patient lives alone and does not have oxygen, hospital bed etc at home.  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  - Daily BMP, trend Cr  ??  Cancer Related pain  - Cont mirtazapine, held for this evening  - Xtampza not on formulary, will need to discuss with pharmacy tmrw  - Cont oxycodone 10mg  every 4 hours as needed  - Cont home bowel regimen  ????  Other chronic medical conditions:  Hypothyroidism:??Cont levothyroxine 150 mcg daily  Depression:??Cont mirtazapine 15 mg nightly, sertraline 50 mg daily  G-tube dependence: Nutrition consulted for assistance  CKD:??Stable, Cr appears to range from 1.27 to 2.12 the past several months    Daily Checklist:  Diet: NPO, nutrition consult for tube feeds  DVT PPx: Lovenox 40mg  q24h  Electrolytes: Replete Potassium to >/= 3.6 and Magnesium to >/= 1.8  Code Status: DNR and DNI  Chief Concern:   Somnolence    Subjective:   HPI:  Walter Armstrong is a 65 y.o. 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 Patient frequently falling asleep on exam and only able to nod his head yes or no occasionally. Awakens to voice. Follows some commands intermittently. Majority of history obtained from patient's neighbor/close friend who he refers to as his brother, Walter Armstrong.    Per patient's HCDM, Walter Armstrong: States he was doing fine earlier. Walter Armstrong drives a school bus and then picked him up for his infusion appointment and was doing okay at that time. Noticed he was wobbly-like towards the beginning of the appointment. States he dozed off waiting for his appointment. When he woke up he fell up against the wall but still states he was feeling fine. They then put him in the infusion room and went through medications with him. States he wasn't interacting as much and needing more oxygen. The pharmacist met with them and was wondering if he took too many oxycodone. States the ED doctor told him concern for aspiration from his tube feeds. They spoke with Walter Armstrong at that time. Walter Armstrong then states he wanted to get hospice set up at patient's home which was the plan. However wants him to get straightened out first. He spoke with the hospice company this afternoon but states he doesn't want him to come home yet. Hospice is planning to be set up at his house tomorrow, they plan to call Walter Armstrong tomorrow. Walter Armstrong said he agrees with him being admitted and getting abx. His kids have been updated as well. States the patient is a proud person. Planning for family to come down tomorrow.     On evaluation of patient, he awakens to voice. Denies any pain, fever/chills, chest pain or SOB. Denies abdominal pain. Follows commands by squeezing fingers, but frequently falls asleep making history difficult to obtain.    In the ED, he was HDS and satting 92% on HFTC 35% FiO2. Labs notable for stable Hb, hyponatremia, stable elevated creatinine and hypoalbuminemia. CXR w/ bibasilar left greater than right streaky and nodular airspace opacities as well as left midlung opacities concerning for aspiration/pneumonia    [employer] Decision Maker:  Walter Armstrong currently lacks decisional capacity for healthcare decision-making and is unable to designate a surrogate healthcare decision maker. Walter Armstrong designated healthcare decision maker(s) is/are Walter Armstrong (the patient's other adult) as denoted by health care power of attorney or other advance directive.    Allergies:  Patient has no known allergies.    Medications:   Prior to Admission medications    Medication Dose, Route, Frequency   diclofenac sodium (VOLTAREN) 1 % gel No dose, route, or frequency recorded.   levothyroxine (SYNTHROID) 150 MCG tablet 150 mcg, G-tube, Daily (standard)   mirtazapine (REMERON) 15 MG tablet 15 mg, G-tube, Nightly   naloxone (NARCAN) 4 mg nasal spray One spray in either nostril once for known/suspected opioid overdose. May repeat every 2-3 minutes in alternating nostril til EMS arrives   ondansetron (ZOFRAN-ODT) 8 MG disintegrating tablet 8 mg, Oral, Every 8 hours PRN, place tablet on tongue and allow to dissolve. Swallow with saliva (no need to administer with liquids).   oxyCODONE (ROXICODONE) 10 mg immediate release tablet Take 1 tab (10 mg) under tongue every 3 hours as needed  for pain with a Max 7 tabs/day.  Patient taking differently: Take 10 mg by mouth every four (4) hours as needed. Take 1 tab (10 mg) under tongue every 4hours as needed for pain with a Max 5 tabs/day. DOSE CHANGE BY CK ON 02/19/21   oxyCODONE myristate (XTAMPZA ER) 9 mg CSpT 9 mg, G-tube, Every 12 hours, X 14 days. Dose change 02/19/21. Use this script-med should be via g-tube not orally.   polyethylene glycol (MIRALAX) 17 gram/dose powder 17 g, G-tube, 2 times a day PRN   prochlorperazine (COMPAZINE) 10 MG tablet 10 mg, G-tube, Every 6 hours PRN   senna (SENNA) 8.6 mg tablet 1 tablet, Oral, 2 times a day (standard)   senna (SENOKOT) 8.6 mg tablet 2 tablets, G-tube, 2 times a day (standard), Hold for diarrhea   sertraline (ZOLOFT) 50 MG tablet 50 mg, G-tube, Daily (standard)       Medical History:  Past Medical History:   Diagnosis Date   ??? Arthritis     bil  knees   ??? Cancer (CMS-HCC)     SCC   ??? CKD (chronic kidney disease), stage III (CMS-HCC) ??? Depressive disorder    ??? Hypothyroidism 09/11/2015   ??? Insomnia 07/24/2014   ??? Mass of throat 2015    treated with chemo and radiation    ??? Psychiatric Medication Trials     mirtazapine 15mg  x 2 months   ??? Substance abuse (CMS-HCC)     alcohol use disorder during college   ??? Substance Use Treatment History     h/o attending AA during college years       Surgical History:  Past Surgical History:   Procedure Laterality Date   ??? CHEMOTHERAPY      NON-BREAST RELATED   ??? KNEE ARTHROSCOPY Bilateral 1976   ??? KNEE SURGERY Bilateral    ??? PR BRONCHOSCOPY,DIAGNOSTIC N/A 12/27/2012    Procedure: BRONCHOSCOPY, RIGID OR FLEXIBLE, W/WO FLUOROSCOPIC GUIDANCE; DIAGNOSTIC, WITH CELL WASHING, WHEN PERFORMED;  Surgeon: Bethel Born, MD;  Location: MAIN OR [facility];  Service: ENT   ??? PR CLOSURE OF GASTROSTOMY,SURGICAL N/A 04/17/2015    Procedure: CLOSURE OF GASTROSTOMY, SURGICAL;  Surgeon: Rema Fendt, MD;  Location: MAIN OR [facility];  Service: Surgical Oncology   ??? PR COLONOSCOPY FLX DX W/COLLJ SPEC WHEN PFRMD N/A 03/27/2014    Procedure: COLONOSCOPY, FLEXIBLE, PROXIMAL TO SPLENIC FLEXURE; DIAGNOSTIC, W/WO COLLECTION SPECIMEN BY BRUSH OR WASH;  Surgeon: Zetta Bills, MD;  Location: GI PROCEDURES [facility];  Service: Gastroenterology   ??? PR COLONOSCOPY FLX DX W/COLLJ SPEC WHEN PFRMD N/A 06/13/2016    Procedure: COLONOSCOPY, FLEXIBLE, PROXIMAL TO SPLENIC FLEXURE; DIAGNOSTIC, W/WO COLLECTION SPECIMEN BY BRUSH OR WASH;  Surgeon: Liane Comber, MD;  Location: [facility] PROCEDURES [facility];  Service: Gastroenterology   ??? PR COLONOSCOPY FLX DX W/COLLJ SPEC WHEN PFRMD N/A 08/11/2018    Procedure: COLONOSCOPY, FLEXIBLE, PROXIMAL TO SPLENIC FLEXURE; DIAGNOSTIC, W/WO COLLECTION SPECIMEN BY BRUSH OR WASH;  Surgeon: Chriss Driver, MD;  [facility];  Service: Gastroenterology   ??? PR CPTR-ASST SURGICAL NAVIGATION IMAGE-LESS Right 12/25/2015    Procedure: COMPUTER-ASSISTED SURGICAL NAVIGATIONAL PROCEDURE FOR MUSCULOSKELETAL PROCEDURES; IMAGE-LESS;  Surgeon: Malka So, MD;  Location: [facility] OR [facility];  Service: Orthopedics   ??? PR CPTR-ASST SURGICAL NAVIGATION IMAGE-LESS Left 05/26/2017    Procedure: COMPUTER-ASSISTED SURGICAL NAVIGATIONAL PROCEDURE FOR MUSCULOSKELETAL PROCEDURES; IMAGE-LESS;  Surgeon: Malka So, MD;  Location: [facility] OR [facility];  Service: Gaylord Shih  Joint Replacement   ??? PR EGD FLEXIBLE FOREIGN BODY REMOVAL N/A 09/10/2015    Procedure: UGI ENDOSCOPY; W/REMOVAL FOREIGN BODY;  Surgeon: Lanier Ensign, MD;  Location: MAIN OR [facility];  Service: Gastroenterology   ??? PR ESOPHAGOSCOPY,DIAGNOSTIC N/A 12/27/2012    Procedure: ESOPHAGOSCOPY, RIGID OR FLEXIBLE; DIAGNOSTIC, W/WO COLLECTION OF SPECIMEN(S) BY BRUSHING OR WASHING;  Surgeon: Bethel Born, MD;  Location: MAIN OR [place];  Service: ENT   ??? PR ESOPHAGOSCOPY,DIAGNOSTIC N/A 10/03/2020    Procedure: ESOPHAGOSCOPY, RIGID OR FLEXIBLE; DIAGNOSTIC, W/WO COLLECTION OF SPECIMEN(S) BY BRUSHING OR WASHING;  Surgeon: Karren Burly, MD;  Location: MAIN OR [place];  Service: ENT   ??? PR EXPLORATION N/FLWD SURG NECK ARTERY Bilateral 10/29/2020    Procedure: EXPLORATION NOT FOLLOWED BY SURGICAL REPAIR, ARTERY; NECK (EG, CAROTID, SUBCLAVIAN);  Surgeon: Karren Burly, MD;  Location: MAIN OR [facility];  Service: ENT   ??? PR LAP,GASTROSTOMY,W/O TUBE CONSTR N/A 10/05/2020    Procedure: LAPAROSCOPY, SURGICAL; GASTOSTOMY W/O CONSTRUCTION OF GASTRIC TUBE (EG, STAMM PROCEDURE)(SEPARATE PROCED);  Surgeon: Camillo Flaming, MD;  Location: MAIN OR [facility];  Service: Surgical Oncology   ??? PR LARYNGOSCOPY,DIRECT,DIAGNOSTIC N/A 12/27/2012    Procedure: LARYNGOSCOPY DIRECT, WITH OR WITHOUT TRACHEOSCOPY; DIAGNOSTIC, EXCEPT NEWBORN;  Surgeon: Bethel Born, MD;  Location: MAIN OR [place];  Service: ENT   ??? PR LARYNGOSCOPY,DIRECT,DX,OP MICROSCOP N/A 10/03/2020    Procedure: LARYNGOSCOPY DIRECT WITH OR WITHOUT TRACHEOSCPY; DIAGNOSTIC, WITH OPERATING MICROSCOPE OR TELESCOPE; Surgeon: Karren Burly, MD;  Location: MAIN OR [facility];  Service: ENT   ??? PR REPAIR ING HERNIA,5+Y/O,REDUCIBL Left 04/04/2018    Procedure: REPAIR INITIAL INGUINAL HERNIA, AGE 64 YEARS OR OLDER; REDUCIBLE;  Surgeon: Katherina Mires, MD;  Location: MAIN OR [facility];  Service: Trauma   ??? PR TOTAL KNEE ARTHROPLASTY Right 12/25/2015    Procedure: ARTHROPLASTY, KNEE, CONDYLE & PLATEAU; MEDIAL & LAT COMPARTMENT W/WO PATELLA RESURFACE (TOTAL KNEE ARTHROP);  Surgeon: Malka So, MD;  Location: [facility] OR [facility];  Service: Orthopedics   ??? PR TOTAL KNEE ARTHROPLASTY Left 05/26/2017    Procedure: ARTHROPLASTY, KNEE, CONDYLE & PLATEAU; MEDIAL & LAT COMPARTMENT W/WO PATELLA RESURFACE (TOTAL KNEE ARTHROP);  Surgeon: Malka So, MD;  [facility]: [facility] OR [facility];  Service: Ortho Joint Replacement   ??? PR TRACHEOSTOMY, PLANNED N/A 11/02/2020    Procedure: AWAKE TRACHEOSTOMY;  Surgeon: Karren Burly, MD;  Location: MAIN OR ;  Service: ENT   ??? PR UP GI ENDOSCOPY,DILATN W GUIDE N/A 09/27/2015    Procedure: UGI ENDOSCOPY; WITH INSERTION OF GUIDE WIRE FOLLOWED BY DILATION OF ESOPHAGUS OVER GUIDE WIRE;  Surgeon: Lanier Ensign, MD;  [facility];  Service: Gastroenterology   ??? PR UP GI ENDOSCOPY,DILATN W GUIDE N/A 07/10/2016    Procedure: UGI ENDOSCOPY; WITH INSERTION OF GUIDE WIRE FOLLOWED BY DILATION OF ESOPHAGUS OVER GUIDE WIRE;  Surgeon: Liane Comber, MD;  [facility]: [facility];  Service: Gastroenterology   ??? PR UP GI ENDOSCOPY,DILATN W GUIDE N/A 07/31/2016    Procedure: UGI ENDOSCOPY; WITH INSERTION OF GUIDE WIRE FOLLOWED BY DILATION OF ESOPHAGUS OVER GUIDE WIRE;  Surgeon: Liane Comber, MD;  [facility];  Service: Gastroenterology   ??? PR UPPER GI ENDOSCOPY,BIOPSY N/A 09/27/2015    Procedure: UGI ENDOSCOPY; WITH BIOPSY, SINGLE OR MULTIPLE;  Surgeon: Lanier Ensign, MD;  [facility];  Service: Gastroenterology   ??? PR UPPER GI ENDOSCOPY,BIOPSY N/A 06/13/2016    Procedure: UGI  ENDOSCOPY; WITH BIOPSY, SINGLE OR MULTIPLE;  Surgeon: Liane Comber, MD;  Location: [facility] PROC[facility];  Service: Gastroenterology   ??? PR UPPER GI ENDOSCOPY,DIAGNOSIS N/A 08/04/2013    Procedure: UGI ENDO, INCLUDE ESOPHAGUS, STOMACH, & DUODENUM &/OR JEJUNUM; DX W/WO COLLECTION SPECIMN, BY BRUSH OR WASH;  Surgeon: Brendia Sacks, MD;  Location: GI PROCEDURES [facility] [facility];  Service: Gastroenterology   ??? PR UPPER GI ENDOSCOPY,DIAGNOSIS N/A 08/06/2016    Procedure: UGI ENDO, INCLUDE ESOPHAGUS, STOMACH, & DUODENUM &/OR JEJUNUM; DX W/WO COLLECTION SPECIMN, BY BRUSH OR WASH;  Surgeon: Zetta Bills, MD;  [facility];  Service: Gastroenterology   ??? RADIATION      NON-BREAST RELATED       Family History:   Family History   Problem Relation Age of Onset   ??? Breast cancer Mother 51   ??? Hypertension Father    ??? Seizures Sister    ??? Tuberculosis Other         stepfather had pulmonary tb and treated when patient lived with him as kid   ??? No Known Problems Brother    ??? No Known Problems Maternal Aunt    ??? No Known Problems Maternal Uncle    ??? No Known Problems Paternal Aunt    ??? No Known Problems Paternal Uncle    ??? No Known Problems Maternal Grandmother    ??? No Known Problems Maternal Grandfather    ??? No Known Problems Paternal Grandmother    ??? No Known Problems Paternal Grandfather    ??? Drug abuse Neg Hx    ??? Depression Neg Hx    ??? Bipolar disorder Neg Hx    ??? Anxiety disorder Neg Hx    ??? Alcohol abuse Neg Hx    ??? Schizophrenia Neg Hx    ??? Physical abuse Neg Hx    ??? Anesthesia problems Neg Hx    ??? Broken bones Neg Hx    ??? Cancer Neg Hx    ??? Clotting disorder Neg Hx    ??? Collagen disease Neg Hx    ??? Diabetes Neg Hx    ??? Dislocations Neg Hx    ??? Fibromyalgia Neg Hx    ??? Gout Neg Hx    ??? Hemophilia Neg Hx    ??? Osteoporosis Neg Hx    ??? Rheumatologic disease Neg Hx    ??? Scoliosis Neg Hx    ??? Severe sprains Neg Hx    ??? Sickle cell anemia Neg Hx    ??? Spinal Compression Fracture Neg Hx        Social History:  The patient lives alone    Social History     Tobacco Use   ??? Smoking status: Current Every Day Smoker     Years: 15.00     Types: Cigars     Start date: 04/03/1988   ??? Smokeless tobacco: Never Used   ??? Tobacco comment: 3-4 little cigars/day with intent to quit   Vaping Use   ??? Vaping Use: Never used   Substance Use Topics   ??? Alcohol use: No     Alcohol/week: 0.0 standard drinks     Comment: quit 20 years ago   ??? Drug use: No     Comment: h/o rare cannabis use (monthly) during college; negative for any other drug use        Review of Systems:  10 systems were reviewed and are negative unless otherwise mentioned in the HPI  Objective:   Physical Exam:  Temp:  [36.8 ??C-37 ??C] 37 ??C  Heart Rate:  [70-109] 70  SpO2 Pulse:  [71-86] 71  Resp:  [11-22] 13  BP: (88-182)/(63-116) 103/70  FiO2 (%):  [28 %-98 %] 35 %  SpO2:  [90 %-100 %] 97 %    Gen: Chronically ill appearing, somnolent, awakens to voice  Eyes: Sclera anicteric, EOMI, PERRLA  HENT: atraumatic, normocephalic, dry mucous membranes  Heart: RRR, S1, S2, no M/R/G, no chest wall tenderness  Lungs: No increased work of breathing, course breath sounds throughout lung fields  Abdomen: Soft, NTND  Extremities: no clubbing, cyanosis, or edema  Neuro: Somnolent, awakens to voice, follows intermittent commands, simultaneously moves all 4 extremities  Psych: Somnolent     Labs/Studies/Imaging:  Labs, Studies, Imaging from the last 24hrs per EMR and personally reviewed

## 2021-02-20 NOTE — Unmapped (Signed)
OCCUPATIONAL THERAPY  Evaluation (with Merlene Morse, PT 2/2 poor activity tolerance) (02/20/21 1418)    Patient Name:  Walter Armstrong       Medical Record Number: [id number]   Date of Birth: [birth year]  Sex: Male          OT Treatment Diagnosis:  decreased activity tolerance, decreased strength, and deconditioning affecting performance and safety with ADLs/IADLs    Assessment  Problem List: Decreased endurance, Decreased strength, Impaired balance, Decreased mobility, Fall Risk, Impaired ADLs, Decreased cognition, Decreased safety awareness, Core weakness  Assessment: Walter Armstrong??is a 66 y.o.??male??with PMHx of CKD3, MDD, hypothyroid and 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 Pt currently has occupational deficits in self-care, functional mobility, and functional transfers. Pt presents with decreased activity tolerance, decreased strength, and deconditioning affecting performance and safety with ADLs/IADLs. Pt completed bed mobility, sit <> stand transfers, and functional mobility with Min A and no AD. OOB mobility was limited 2/2 to poor activity tolerance and weakness. Pt has friends/family nearby who can provide physical assistance as needed upon discharge. Per MD team, pt anticipating discharged to home hospice. Pt is currently appropriate for 3x post-acute occupational therapy services to decrease caregiver burden and maximize safety and independence in ADLs. Based on consideration of pt's occupational profile, assessment review, level of clinical decision making involved, and intervention plan, this pt is considered to be a moderate complexity case.    Today's Interventions: supine <> sit, sit <> stand, toileting, bed mobility, and functional mobility. OT educating pt re: EOB/OOB mobility with assistance, maximizing independence in self-care outside of therapy, post-acute recs, role of OT, OT POC.    Activity Tolerance During Today's Session  Tolerated treatment well, Limited by fatigue    Plan  Planned Frequency of Treatment:  1-2x per day for: 2-3x week     Planned Interventions:  Functional mobility, Functional cognition, Environmental support, Endurance activities, Education - Family / caregiver, Adaptive equipment, ADL retraining, Balance activities, Bed mobility, Compensatory tech. training, Conservation, Education - Patient, Field seismologist education, Teacher, early years/pre, Therapeutic exercise, Home exercise program    Post-Discharge Occupational Therapy Recommendations:   3x weekly   OT DME Recommendations: Shower chair with back, Three in one commode -      GOALS:   Patient and Family Goals: To return home  IP Long Term Goal #1: Pt will score 18+/24 on AMPAC in 8 weeks     Short Term:  Pt will complete functional mobility with supervision for safe access to areas of ADLs   Time Frame : 2 weeks  Pt will complete full body dressing with set up to decrease caregiver burden   Time Frame : 2 weeks     Prognosis:  Fair  Positive Indicators:  family/friend support  Barriers to Discharge: Inability to safely perform ADLS, Endurance deficits, Gait instability, Impaired Balance, Decreased safety awareness, Functional strength deficits    Subjective  Current Status Pt received and left supine in bed with call bell and all needs in reach. RN aware  Prior Functional Status Pt reports being independent in ADLs/IADLs without an AD. He reports that he does not currently utilize DME. Pt lives alone and has friends/family nearby who are able to provide physical assistance as needed. Pt denied any falls within the past 6 months.    Medical Tests / Procedures: Reviewed in Epic  Patient / Caregiver reports: I don't need any equipment    Past Medical History:   Diagnosis Date   ??? Arthritis     bil  knees   ??? Cancer (CMS-HCC)     SCC ??? CKD (chronic kidney disease), stage III (CMS-HCC)    ??? Depressive disorder    ??? Hypothyroidism 09/11/2015   ??? Insomnia 07/24/2014   ??? Mass of throat 2015    treated with chemo and radiation    ??? Psychiatric Medication Trials     mirtazapine 15mg  x 2 months   ??? Substance abuse (CMS-HCC)     alcohol use disorder during college   ??? Substance Use Treatment History     h/o attending AA during college years    Social History     Tobacco Use   ??? Smoking status: Current Every Day Smoker     Years: 15.00     Types: Cigars     Start date: 04/03/1988   ??? Smokeless tobacco: Never Used   ??? Tobacco comment: 3-4 little cigars/day with intent to quit   Substance Use Topics   ??? Alcohol use: No     Alcohol/week: 0.0 standard drinks     Comment: quit 20 years ago      Past Surgical History:   Procedure Laterality Date   ??? CHEMOTHERAPY      NON-BREAST RELATED   ??? KNEE ARTHROSCOPY Bilateral 1976   ??? KNEE SURGERY Bilateral    ??? PR BRONCHOSCOPY,DIAGNOSTIC N/A 12/27/2012    Procedure: BRONCHOSCOPY, RIGID OR FLEXIBLE, W/WO FLUOROSCOPIC GUIDANCE; DIAGNOSTIC, WITH CELL WASHING, WHEN PERFORMED;  Surgeon: Bethel Born, MD;  Location: [facility];  Service: ENT   ??? PR CLOSURE OF GASTROSTOMY,SURGICAL N/A 04/17/2015    Procedure: CLOSURE OF GASTROSTOMY, SURGICAL;  Surgeon: Rema Fendt, MD;  Location: MAIN OR [facility];  Service: Surgical Oncology   ??? PR COLONOSCOPY FLX DX W/COLLJ SPEC WHEN PFRMD N/A 03/27/2014    Procedure: COLONOSCOPY, FLEXIBLE, PROXIMAL TO SPLENIC FLEXURE; DIAGNOSTIC, W/WO COLLECTION SPECIMEN BY BRUSH OR WASH;  Surgeon: Zetta Bills, MD;  Location: GI PROCEDURES [facility] [facility];  Service: Gastroenterology   ??? PR COLONOSCOPY FLX DX W/COLLJ SPEC WHEN PFRMD N/A 06/13/2016    Procedure: COLONOSCOPY, FLEXIBLE, PROXIMAL TO SPLENIC FLEXURE; DIAGNOSTIC, W/WO COLLECTION SPECIMEN BY BRUSH OR WASH;  Surgeon: Liane Comber, MD;  Location: [facility];  Service: Gastroenterology   ??? PR COLONOSCOPY FLX DX W/COLLJ SPEC WHEN PFRMD N/A 08/11/2018    Procedure: COLONOSCOPY, FLEXIBLE, PROXIMAL TO SPLENIC FLEXURE; DIAGNOSTIC, W/WO COLLECTION SPECIMEN BY BRUSH OR WASH;  Surgeon: Chriss Driver, MD;  [facility];  Service: Gastroenterology   ??? PR CPTR-ASST SURGICAL NAVIGATION IMAGE-LESS Right 12/25/2015    Procedure: COMPUTER-ASSISTED SURGICAL NAVIGATIONAL PROCEDURE FOR MUSCULOSKELETAL PROCEDURES; IMAGE-LESS;  Surgeon: Malka So, MD;  Location: [facility] OR [facility];  Service: Orthopedics   ??? PR CPTR-ASST SURGICAL NAVIGATION IMAGE-LESS Left 05/26/2017    Procedure: COMPUTER-ASSISTED SURGICAL NAVIGATIONAL PROCEDURE FOR MUSCULOSKELETAL PROCEDURES; IMAGE-LESS;  Surgeon: Malka So, MD;  Location: [facility];  Service: Ortho Joint Replacement   ??? PR EGD FLEXIBLE FOREIGN BODY REMOVAL N/A 09/10/2015    Procedure: UGI ENDOSCOPY; W/REMOVAL FOREIGN BODY;  Surgeon: Lanier Ensign, MD;  Location: MAIN OR [facility];  Service: Gastroenterology   ??? PR ESOPHAGOSCOPY,DIAGNOSTIC N/A 12/27/2012    Procedure: ESOPHAGOSCOPY, RIGID OR FLEXIBLE; DIAGNOSTIC, W/WO COLLECTION OF SPECIMEN(S) BY BRUSHING OR WASHING;  Surgeon: Bethel Born,  MD;  Location: MAIN OR [place];  Service: ENT   ??? PR ESOPHAGOSCOPY,DIAGNOSTIC N/A 10/03/2020    Procedure: ESOPHAGOSCOPY, RIGID OR FLEXIBLE; DIAGNOSTIC, W/WO COLLECTION OF SPECIMEN(S) BY BRUSHING OR WASHING;  Surgeon: Karren Burly, MD;  Location: MAIN OR [place];  Service: ENT   ??? PR EXPLORATION N/FLWD SURG NECK ARTERY Bilateral 10/29/2020    Procedure: EXPLORATION NOT FOLLOWED BY SURGICAL REPAIR, ARTERY; NECK (EG, CAROTID, SUBCLAVIAN);  Surgeon: Karren Burly, MD;  Location: MAIN OR [facility];  Service: ENT   ??? PR LAP,GASTROSTOMY,W/O TUBE CONSTR N/A 10/05/2020    Procedure: LAPAROSCOPY, SURGICAL; GASTOSTOMY W/O CONSTRUCTION OF GASTRIC TUBE (EG, STAMM PROCEDURE)(SEPARATE PROCED);  Surgeon: Camillo Flaming, MD;  Location: MAIN OR [facility];  Service: Surgical Oncology ??? PR LARYNGOSCOPY,DIRECT,DIAGNOSTIC N/A 12/27/2012    Procedure: LARYNGOSCOPY DIRECT, WITH OR WITHOUT TRACHEOSCOPY; DIAGNOSTIC, EXCEPT NEWBORN;  Surgeon: Bethel Born, MD;  Location: MAIN OR [place];  Service: ENT   ??? PR LARYNGOSCOPY,DIRECT,DX,OP MICROSCOP N/A 10/03/2020    Procedure: LARYNGOSCOPY DIRECT WITH OR WITHOUT TRACHEOSCPY; DIAGNOSTIC, WITH OPERATING MICROSCOPE OR TELESCOPE;  Surgeon: Karren Burly, MD;  Location: MAIN OR [facility];  Service: ENT   ??? PR REPAIR ING HERNIA,5+Y/O,REDUCIBL Left 04/04/2018    Procedure: REPAIR INITIAL INGUINAL HERNIA, AGE 1 YEARS OR OLDER; REDUCIBLE;  Surgeon: Katherina Mires, MD;  Location: MAIN OR [facility];  Service: Trauma   ??? PR TOTAL KNEE ARTHROPLASTY Right 12/25/2015    Procedure: ARTHROPLASTY, KNEE, CONDYLE & PLATEAU; MEDIAL & LAT COMPARTMENT W/WO PATELLA RESURFACE (TOTAL KNEE ARTHROP);  Surgeon: Malka So, MD;  [facility]: [facility] OR [facility];  Service: Orthopedics   ??? PR TOTAL KNEE ARTHROPLASTY Left 05/26/2017    Procedure: ARTHROPLASTY, KNEE, CONDYLE & PLATEAU; MEDIAL & LAT COMPARTMENT W/WO PATELLA RESURFACE (TOTAL KNEE ARTHROP);  Surgeon: Malka So, MD;  Location: [facility] OR [facility];  Service: Ortho Joint Replacement   ??? PR TRACHEOSTOMY, PLANNED N/A 11/02/2020    Procedure: AWAKE TRACHEOSTOMY;  Surgeon: Karren Burly, MD;  Location: MAIN OR [place];  Service: ENT   ??? PR UP GI ENDOSCOPY,DILATN W GUIDE N/A 09/27/2015    Procedure: UGI ENDOSCOPY; WITH INSERTION OF GUIDE WIRE FOLLOWED BY DILATION OF ESOPHAGUS OVER GUIDE WIRE;  Surgeon: Lanier Ensign, MD;  [facility]: [facility];  Service: Gastroenterology   ??? PR UP GI ENDOSCOPY,DILATN W GUIDE N/A 07/10/2016    Procedure: UGI ENDOSCOPY; WITH INSERTION OF GUIDE WIRE FOLLOWED BY DILATION OF ESOPHAGUS OVER GUIDE WIRE;  Surgeon: Liane Comber, MD;  [facility];  Service: Gastroenterology   ??? PR UP GI ENDOSCOPY,DILATN W GUIDE N/A 07/31/2016    Procedure: UGI ENDOSCOPY; WITH INSERTION OF GUIDE WIRE FOLLOWED BY DILATION OF ESOPHAGUS OVER GUIDE WIRE;  Surgeon: Liane Comber, MD;  [facility];  Service: Gastroenterology   ??? PR UPPER GI ENDOSCOPY,BIOPSY N/A 09/27/2015    Procedure: UGI ENDOSCOPY; WITH BIOPSY, SINGLE OR MULTIPLE;  Surgeon: Lanier Ensign, MD;  [facility];  Service: Gastroenterology   ??? PR UPPER GI ENDOSCOPY,BIOPSY N/A 06/13/2016    Procedure: UGI ENDOSCOPY; WITH BIOPSY, SINGLE OR MULTIPLE;  Surgeon: Liane Comber, MD;  Location: [facility];  Service: Gastroenterology   ??? PR UPPER GI ENDOSCOPY,DIAGNOSIS N/A 08/04/2013    Procedure: UGI ENDO, INCLUDE ESOPHAGUS, STOMACH, & DUODENUM &/OR JEJUNUM; DX W/WO COLLECTION SPECIMN, BY BRUSH OR WASH;  Surgeon: Brendia Sacks, MD;  Location: [facility] PROCEDURES [place] [facility];  Service: Gastroenterology   ???  PR UPPER GI ENDOSCOPY,DIAGNOSIS N/A 08/06/2016    Procedure: UGI ENDO, INCLUDE ESOPHAGUS, STOMACH, & DUODENUM &/OR JEJUNUM; DX W/WO COLLECTION SPECIMN, BY BRUSH OR WASH;  Surgeon: Zetta Bills, MD;  [facility];  Service: Gastroenterology   ??? RADIATION      NON-BREAST RELATED    Family History   Problem Relation Age of Onset   ??? Breast cancer Mother 33   ??? Hypertension Father    ??? Seizures Sister    ??? Tuberculosis Other         stepfather had pulmonary tb and treated when patient lived with him as kid   ??? No Known Problems Brother    ??? No Known Problems Maternal Aunt    ??? No Known Problems Maternal Uncle    ??? No Known Problems Paternal Aunt    ??? No Known Problems Paternal Uncle    ??? No Known Problems Maternal Grandmother    ??? No Known Problems Maternal Grandfather    ??? No Known Problems Paternal Grandmother    ??? No Known Problems Paternal Grandfather    ??? Drug abuse Neg Hx    ??? Depression Neg Hx    ??? Bipolar disorder Neg Hx    ??? Anxiety disorder Neg Hx    ??? Alcohol abuse Neg Hx    ??? Schizophrenia Neg Hx    ??? Physical abuse Neg Hx    ??? Anesthesia problems Neg Hx    ??? Broken bones Neg Hx    ??? Cancer Neg Hx    ??? Clotting disorder Neg Hx    ??? Collagen disease Neg Hx    ??? Diabetes Neg Hx    ??? Dislocations Neg Hx    ??? Fibromyalgia Neg Hx    ??? Gout Neg Hx    ??? Hemophilia Neg Hx    ??? Osteoporosis Neg Hx    ??? Rheumatologic disease Neg Hx    ??? Scoliosis Neg Hx    ??? Severe sprains Neg Hx    ??? Sickle cell anemia Neg Hx    ??? Spinal Compression Fracture Neg Hx         Patient has no known allergies.     Objective Findings  Precautions / Restrictions  Aspiration precautions, Falls precautions    Weight Bearing  Non-applicable    Required Braces or Orthoses  Non-applicable    Pain  Pt denied any pain    Equipment / Environment  Vascular access (PIV, TLC, Port-a-cath, PICC), Patient not wearing mask for full session, Supplemental oxygen (8L O2 FiO2 35% via trach mask)    Living Situation  Living Environment: Apartment  Lives With: Alone  Home Living: Tub/shower unit, Standard height toilet, One level home, Level entry  Equipment available at home: None     Cognition   Orientation Level:  Disoriented to situation   Arousal/Alertness:  Appropriate responses to stimuli   Attention Span:  Appears intact   Memory:  Decreased recall of recent events   Following Commands:  Follows all commands and directions without difficulty   Safety Judgment:  Decreased awareness of need for assistance, Decreased awareness of need for safety   Awareness of Errors:  Good awareness of errors made   Problem Solving:  Able to problem solve independently    Vision / Hearing   Vision: No deficits identified   Hearing: No deficit identified       Hand Function:  Right Hand Function: Right hand grip strength, ROM  and coordination WNL  Left Hand Function: Left hand grip strength, ROM and coordination WNL    Skin Inspection:  Skin Inspection: Intact where visualized    ROM / Strength:  UE ROM/Strength: Left Impaired/Limited, Right Impaired/Limited  UE ROM/ Strength Comment: BUE functionally weak  LE ROM/Strength: Left Impaired/Limited, Right Impaired/Limited  LE ROM/ Strength Comment: BLE functionally weak    Coordination:  Coordination: WFL    Sensation:  Sensory/ Proprioception/ Stereognosis comments: Pt denied any numbness/tingling in hands and feet    Balance:  Static and dynamic sitting balance SBA, static and dynamic standing balance CGA and no AD    Functional Mobility  Transfer Assistance Needed: Yes (sit <> stand with SBA with HHA)  Bed Mobility Assistance Needed: Yes (supine <> sit Min A)  Ambulation: Pt completed functional mobility at short household distances with CGA with HHA. Pt completed ~3 lateral steps toward HOB with CGA      ADLs  ADLs: Needs assistance with ADLs  ADLs - Needs Assistance: Grooming, Bathing, Toileting, UB dressing, LB dressing, Feeding  Feeding - Needs Assistance: Set Up Assist  Grooming - Needs Assistance: Set Up Assist  Bathing - Needs Assistance: Performed seated, Mod assist  Toileting - Needs Assistance: Min assist  UB Dressing - Needs Assistance: Min assist  LB Dressing - Needs Assistance: Performed seated, Mod assist    Vitals / Orthostatics  At Rest: SpO2: 96% on 8L FiO2 35% via trach mask, HR: 88, BP: 133/107  With Activity: HR 84, SpO2: Pt taking off trach mask and declined to keep it on for OOB mobility; pt desating to 88% on RA (NAD). Pt left on 8L FiO2 35% via trach mask with SpO2: 96%. RN aware.  Orthostatics: asymptomatic    Medical Staff Made Aware: Denyse Amass, RN updated and aware    Occupational Therapy Session Duration  OT Individual [mins]: 23  Reason for Co-treatment: To safely progress mobility, Poor activity tolerance       I attest that I have reviewed the above information.  Signed: Bonnita Nasuti, OT  Filed 02/20/2021    The care for this patient was completed by Bonnita Nasuti, OT:  A student was present and participated in the care. Licensed/Credentialed therapist was physically present and immediately available to direct and supervise tasks that were related to patient management. The direction and supervision was continuous throughout the time these tasks were performed.    Bonnita Nasuti, OT

## 2021-02-20 NOTE — Unmapped (Signed)
PHYSICAL THERAPY  Evaluation (02/20/21 1414)          Patient Name:?? Walter Armstrong????????   Medical Record Number: [id number]   Date of Birth: August 17, 1956  Sex: Male??  ??    Treatment Diagnosis: Impaired balance and endurance     Activity Tolerance: Tolerated treatment well     ASSESSMENT  Problem List: Decreased mobility, Decreased cognition, Decreased safety awareness, Fall Risk, Decreased endurance      Assessment : Pt is a 65 y.o. 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 Pt able to perform OOB mobility today for PT evaluation. Pt performed bed mobility SBA, sit/stand transfer SBA and gait CGA with hand held assist. Pt does ambulate with decreased cadence, shuffled gait with NBOS and recommends using RW. Pt presents with overall deconditioning and decreased mobility. Pt will benefit from skilled acute PT. PT recommendations 3x. After a review of the personal factors, comorbidities, clinical presentation, and examination of the number of affected body systems, the patient presents as a moderate complexity case.       Today's Interventions: PT educated pt safety with mobility, PT role and PT POC. Reviewed dc recs and appropriate DME.                            PLAN  Planned Frequency of Treatment:?? 1-2x per day for: 3-4x week       Planned Interventions: Balance activities, Diaphragmatic / Pursed-lip breathing, Education - Patient, Home exercise program, Gait training, Education - Family / caregiver, Endurance activities, Therapeutic exercise, Therapeutic activity, Teacher, early years/pre, Self-care / Home training     Post-Discharge Physical Therapy Recommendations:?? 3x weekly     PT DME Recommendations: Walker (rolling)??????????       Goals:   Patient and Family Goals: None stated              SHORT GOAL #1: Pt will perform transfers mod I with LRAD  ?????????????????????? Time Frame : 1 week  SHORT GOAL #2: Pt will ambulate household distances 100' mod I with LRAD  ?????????????????????? Time Frame : 1 week     ??????????????????????       ??????????????????????       ??????????????????????       Prognosis:?? Good     Barriers to Discharge: Inability to safely perform ADLS, Decreased caregiver support, Endurance deficits, Gait instability, Impaired Balance     SUBJECTIVE  Patient reports: I need to pee  Current Functional Status: Pt received supine, left supine with HOB elevated, call bell in reach and all needs met. PT updated RN and MD.     Prior Functional Status: Pt states independent with all mobility, no AD. Pt has friend next door who can assist. Family also live nearby per pt report however unsure if they can assist.  Equipment available at home: (P) None      Past Medical History:   Diagnosis Date   ??? Arthritis     bil  knees   ??? Cancer (CMS-HCC)     SCC   ??? CKD (chronic kidney disease), stage III (CMS-HCC)    ??? Depressive disorder    ??? Hypothyroidism 09/11/2015   ??? Insomnia 07/24/2014   ??? Mass of throat 2015    treated with chemo and radiation    ???  Psychiatric Medication Trials     mirtazapine 15mg  x 2 months   ??? Substance abuse (CMS-HCC)     alcohol use disorder during college   ??? Substance Use Treatment History     h/o attending AA during college years            Social History     Tobacco Use   ??? Smoking status: Current Every Day Smoker     Years: 15.00     Types: Cigars     Start date: 04/03/1988   ??? Smokeless tobacco: Never Used   ??? Tobacco comment: 3-4 little cigars/day with intent to quit   Substance Use Topics   ??? Alcohol use: No     Alcohol/week: 0.0 standard drinks     Comment: quit 20 years ago       Past Surgical History:   Procedure Laterality Date   ??? CHEMOTHERAPY      NON-BREAST RELATED   ??? KNEE ARTHROSCOPY Bilateral 1976   ??? KNEE SURGERY Bilateral    ??? PR BRONCHOSCOPY,DIAGNOSTIC N/A 12/27/2012    Procedure: BRONCHOSCOPY, RIGID OR FLEXIBLE, W/WO FLUOROSCOPIC GUIDANCE; DIAGNOSTIC, WITH CELL WASHING, WHEN PERFORMED;  Surgeon: Bethel Born, MD;  Location: MAIN OR [facility];  Service: ENT   ??? PR CLOSURE OF GASTROSTOMY,SURGICAL N/A 04/17/2015    Procedure: CLOSURE OF GASTROSTOMY, SURGICAL;  Surgeon: Rema Fendt, MD;  [facility]: [facility]IN [facility];  Service: Surgical Oncology   ??? PR COLONOSCOPY FLX DX W/COLLJ SPEC WHEN PFRMD N/A 03/27/2014    Procedure: COLONOSCOPY, FLEXIBLE, PROXIMAL TO SPLENIC FLEXURE; DIAGNOSTIC, W/WO COLLECTION SPECIMEN BY BRUSH OR WASH;  Surgeon: Zetta Bills, MD;  Location: [facility] PROCEDURES [facility];  Service: Gastroenterology   ??? PR COLONOSCOPY FLX DX W/COLLJ SPEC WHEN PFRMD N/A 06/13/2016    Procedure: COLONOSCOPY, FLEXIBLE, PROXIMAL TO SPLENIC FLEXURE; DIAGNOSTIC, W/WO COLLECTION SPECIMEN BY BRUSH OR WASH;  Surgeon: Liane Comber, MD;  [facility]: [facility];  Service: Gastroenterology   ??? PR COLONOSCOPY FLX DX W/COLLJ SPEC WHEN PFRMD N/A 08/11/2018    Procedure: COLONOSCOPY, FLEXIBLE, PROXIMAL TO SPLENIC FLEXURE; DIAGNOSTIC, W/WO COLLECTION SPECIMEN BY BRUSH OR WASH;  Surgeon: Chriss Driver, MD;  [facility];  Service: Gastroenterology   ??? PR CPTR-ASST SURGICAL NAVIGATION IMAGE-LESS Right 12/25/2015    Procedure: COMPUTER-ASSISTED SURGICAL NAVIGATIONAL PROCEDURE FOR MUSCULOSKELETAL PROCEDURES; IMAGE-LESS;  Surgeon: Malka So, MD;  Location: [facility] OR [facility];  Service: Orthopedics   ??? PR CPTR-ASST SURGICAL NAVIGATION IMAGE-LESS Left 05/26/2017    Procedure: COMPUTER-ASSISTED SURGICAL NAVIGATIONAL PROCEDURE FOR MUSCULOSKELETAL PROCEDURES; IMAGE-LESS;  Surgeon: Malka So, MD;  Location: [facility] OR [facility];  Service: Ortho Joint Replacement   ??? PR EGD FLEXIBLE FOREIGN BODY REMOVAL N/A 09/10/2015    Procedure: UGI ENDOSCOPY; W/REMOVAL FOREIGN BODY;  Surgeon: Lanier Ensign, MD;  Location: MAIN OR [facility];  Service: Gastroenterology   ??? PR ESOPHAGOSCOPY,DIAGNOSTIC N/A 12/27/2012    Procedure: ESOPHAGOSCOPY, RIGID OR FLEXIBLE; DIAGNOSTIC, W/WO COLLECTION OF SPECIMEN(S) BY BRUSHING OR WASHING;  Surgeon: Bethel Born, MD;  Location: MAIN OR [place];  Service: ENT   ??? PR ESOPHAGOSCOPY,DIAGNOSTIC N/A 10/03/2020    Procedure: ESOPHAGOSCOPY, RIGID OR FLEXIBLE; DIAGNOSTIC, W/WO COLLECTION OF SPECIMEN(S) BY BRUSHING OR WASHING;  Surgeon: Karren Burly, MD;  Location: MAIN OR [place];  Service: ENT   ??? PR EXPLORATION N/FLWD SURG NECK ARTERY Bilateral 10/29/2020    Procedure: EXPLORATION NOT FOLLOWED BY SURGICAL REPAIR, ARTERY; NECK (EG, CAROTID, SUBCLAVIAN);  Surgeon:  Samip Carlean Purl, MD;  Location: MAIN OR [place];  Service: ENT   ??? PR LAP,GASTROSTOMY,W/O TUBE CONSTR N/A 10/05/2020    Procedure: LAPAROSCOPY, SURGICAL; GASTOSTOMY W/O CONSTRUCTION OF GASTRIC TUBE (EG, STAMM PROCEDURE)(SEPARATE PROCED);  Surgeon: Camillo Flaming, MD;  Location: MAIN OR [facility];  Service: Surgical Oncology   ??? PR LARYNGOSCOPY,DIRECT,DIAGNOSTIC N/A 12/27/2012    Procedure: LARYNGOSCOPY DIRECT, WITH OR WITHOUT TRACHEOSCOPY; DIAGNOSTIC, EXCEPT NEWBORN;  Surgeon: Bethel Born, MD;  Location: MAIN OR [place];  Service: ENT   ??? PR LARYNGOSCOPY,DIRECT,DX,OP MICROSCOP N/A 10/03/2020    Procedure: LARYNGOSCOPY DIRECT WITH OR WITHOUT TRACHEOSCPY; DIAGNOSTIC, WITH OPERATING MICROSCOPE OR TELESCOPE;  Surgeon: Karren Burly, MD;  Location: MAIN OR [facility];  Service: ENT   ??? PR REPAIR ING HERNIA,5+Y/O,REDUCIBL Left 04/04/2018    Procedure: REPAIR INITIAL INGUINAL HERNIA, AGE 43 YEARS OR OLDER; REDUCIBLE;  Surgeon: Katherina Mires, MD;  Location: MAIN OR [facility]  Service: Trauma   ??? PR TOTAL KNEE ARTHROPLASTY Right 12/25/2015    Procedure: ARTHROPLASTY, KNEE, CONDYLE & PLATEAU; MEDIAL & LAT COMPARTMENT W/WO PATELLA RESURFACE (TOTAL KNEE ARTHROP);  Surgeon: Malka So, MD;  Location: [facility] OR [facility];  Service: Orthopedics   ??? PR TOTAL KNEE ARTHROPLASTY Left 05/26/2017    Procedure: ARTHROPLASTY, KNEE, CONDYLE & PLATEAU; MEDIAL & LAT COMPARTMENT W/WO PATELLA RESURFACE (TOTAL KNEE ARTHROP);  Surgeon: Malka So, MD;  Location: [facility] OR [facility];  Service: Ortho Joint Replacement   ??? PR TRACHEOSTOMY, PLANNED N/A 11/02/2020    Procedure: AWAKE TRACHEOSTOMY;  Surgeon: Karren Burly, MD;  Location: MAIN OR [place];  Service: ENT   ??? PR UP GI ENDOSCOPY,DILATN W GUIDE N/A 09/27/2015    Procedure: UGI ENDOSCOPY; WITH INSERTION OF GUIDE WIRE FOLLOWED BY DILATION OF ESOPHAGUS OVER GUIDE WIRE;  Surgeon: Lanier Ensign, MD;  [facility]: [facility];  Service: Gastroenterology   ??? PR UP GI ENDOSCOPY,DILATN W GUIDE N/A 07/10/2016    Procedure: UGI ENDOSCOPY; WITH INSERTION OF GUIDE WIRE FOLLOWED BY DILATION OF ESOPHAGUS OVER GUIDE WIRE;  Surgeon: Liane Comber, MD;  [facility]: [facility];  Service: Gastroenterology   ??? PR UP GI ENDOSCOPY,DILATN W GUIDE N/A 07/31/2016    Procedure: UGI ENDOSCOPY; WITH INSERTION OF GUIDE WIRE FOLLOWED BY DILATION OF ESOPHAGUS OVER GUIDE WIRE;  Surgeon: Liane Comber, MD;  Location: [facility];  Service: Gastroenterology   ??? PR UPPER GI ENDOSCOPY,BIOPSY N/A 09/27/2015    Procedure: UGI ENDOSCOPY; WITH BIOPSY, SINGLE OR MULTIPLE;  Surgeon: Lanier Ensign, MD;  [facility]: [facility];  Service: Gastroenterology   ??? PR UPPER GI ENDOSCOPY,BIOPSY N/A 06/13/2016    Procedure: UGI ENDOSCOPY; WITH BIOPSY, SINGLE OR MULTIPLE;  Surgeon: Liane Comber, MD;  Location: [facility];  Service: Gastroenterology   ??? PR UPPER GI ENDOSCOPY,DIAGNOSIS N/A 08/04/2013    Procedure: UGI ENDO, INCLUDE ESOPHAGUS, STOMACH, & DUODENUM &/OR JEJUNUM; DX W/WO COLLECTION SPECIMN, BY BRUSH OR WASH;  Surgeon: Brendia Sacks, MD;  [facility]: [facility];  Service: Gastroenterology   ??? PR UPPER GI ENDOSCOPY,DIAGNOSIS N/A 08/06/2016    Procedure: UGI ENDO, INCLUDE ESOPHAGUS, STOMACH, & DUODENUM &/OR JEJUNUM; DX W/WO COLLECTION SPECIMN, BY BRUSH OR WASH;  Surgeon: Zetta Bills, MD;  [facility]: [facility];  Service: Gastroenterology   ??? RADIATION      NON-BREAST RELATED             Family History   Problem Relation  Age of Onset   ??? Breast cancer Mother 85   ??? Hypertension Father    ??? Seizures Sister    ??? Tuberculosis Other         stepfather had pulmonary tb and treated when patient lived with him as kid   ??? No Known Problems Brother    ??? No Known Problems Maternal Aunt    ??? No Known Problems Maternal Uncle    ??? No Known Problems Paternal Aunt    ??? No Known Problems Paternal Uncle    ??? No Known Problems Maternal Grandmother    ??? No Known Problems Maternal Grandfather    ??? No Known Problems Paternal Grandmother    ??? No Known Problems Paternal Grandfather    ??? Drug abuse Neg Hx    ??? Depression Neg Hx    ??? Bipolar disorder Neg Hx    ??? Anxiety disorder Neg Hx    ??? Alcohol abuse Neg Hx    ??? Schizophrenia Neg Hx    ??? Physical abuse Neg Hx    ??? Anesthesia problems Neg Hx    ??? Broken bones Neg Hx    ??? Cancer Neg Hx    ??? Clotting disorder Neg Hx    ??? Collagen disease Neg Hx    ??? Diabetes Neg Hx    ??? Dislocations Neg Hx    ??? Fibromyalgia Neg Hx    ??? Gout Neg Hx    ??? Hemophilia Neg Hx    ??? Osteoporosis Neg Hx    ??? Rheumatologic disease Neg Hx    ??? Scoliosis Neg Hx    ??? Severe sprains Neg Hx    ??? Sickle cell anemia Neg Hx    ??? Spinal Compression Fracture Neg Hx         Allergies: Patient has no known allergies.                  Objective Findings  Precautions / Restrictions  Precautions: (P) Aspiration precautions, Falls precautions  Weight Bearing Status: (P) Non-applicable  Required Braces or Orthoses: (P) Non-applicable     Communication Preference: Verbal          Pain Comments: no c/o pain  Medical Tests / Procedures: Reviewed  Equipment / Environment: (P) Vascular access (PIV, TLC, Port-a-cath, PICC), Patient not wearing mask for full session, Supplemental oxygen (8L O2 FiO2 35%)     At Rest: SpO2 96% on trach collar 6L HR 85  With Activity: SpO2 100% after short gait (no trach collar) but when back in supine on stretcher 87-88% without trach collar  Orthostatics: no signs/symptoms        Living Situation  Living Environment: (P) Apartment  Lives With: (P) Alone  Home Living: (P) Tub/shower unit, Standard height toilet, One level home, Level entry      Cognition: WFL  Cognition comment: able to follow commands and respond appropriately  Visual/Perception: Within Functional Limits     Skin Inspection: Intact where visualized     Upper Extremities  UE ROM: Right WFL, Left WFL  UE Strength: Right WFL, Left WFL    Lower Extremities  LE ROM: Right WFL, Left WFL  LE Strength: Right WFL, Left WFL     Sensation: WFL  Balance: Impaired  Balance comment: sitting: independent standing: CGA no AD      Bed Mobility: Supine to Sit  Supine to Sit assistance level: Standby assist, set-up cues, supervision of patient - no hands on  Bed Mobility: supine to/from sit SBA     Transfers: Sit to Stand  Sit to Stand assistance level: Standby assist, set-up cues, supervision of patient - no hands on  Transfer comments: no AD      Gait Level of Assistance: Contact guard assist, steadying assist  Gait Assistive Device: Other (Comment) (HHA)  Gait Distance Ambulated (ft): 30 ft  Gait: Pt ambualtes with slow cadence, shuffed gait. Pt will benefit from use of RW however pt stating My apartment is too small PT reviewed cane vs RW.     Stairs: n/a            Endurance: Impaired activity tolerance     Physical Therapy Session Duration  PT Individual [mins]: 24 (co eval with Bonnita Nasuti, OT)     Medical Staff Made Aware: RN made aware     I attest that I have reviewed the above information.  Signed: Earl Lites, PT  Filed 02/20/2021

## 2021-02-20 NOTE — Unmapped (Addendum)
Walter Armstrong is a 65 y.o. 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    Acute Hypoxic Respiratory Failure  Aspiration Pneumonia  Patient developed acute hypoxic respiratory failure, sating 85% on room air, and requiring 8L 35% FiO2 via trach. This occurred in the setting of likely aspiration event with tube feeds draining from trach in the ED. Chest xray showed bibasilar opacities and left mid lung opacities consistent with aspiration pneumonia. Patient was clear of his goal to discharge as quickly as possible with home hospice. He was transitioned from Unasyn to Augmentin to complete a 5 day course, had home oxygen coordinated and was discharged with home hospice. To reduce the risk of further aspiration events his home tube feeds were transitioned to cyclic feeds.    Somnolence  Etiology of his somnolence remains unclear. Initially suspected excess opioid use but patient reports no increased use of opioids leading up to the event. It is possible patient was somnolent due to hypoxia and hypercarbia. Mental status rapidly improved in the ED and patient was at his baseline on the date of discharge.    Oropharyngeal SCC  New primary neck SCC s/p tracheostomy  Follows with Dr. Janann August. Had a history of oropharynx SCC treated with chemo and radiation therapy in 2014 and now with a new primary SCC. S/p tracheostomy on 3/4 due to respiratory distress and stridor from pharyngeal wall edema. Per ENT, he is not a surgical candidate due to gross tumor extension into prevertebral fascia and bilateral carotid sheaths. He was started on pembrolizumab/paclitaxel/carboplatin and received C2D1(12/03/20) but unfortunately was admitted to hospital soon after for stercoral colitis and GNR bacteremia. Patient has been having ongoing goals of care discussions with his primary oncologist and had been in the process of establishing with home hospice. He was clear of his goals during this admission. He wanted to return home with home hospice. He was established with home hospice prior to discharge.     Cancer related pain  Patient has been off of his home Xtampza for the past few weeks. At home he has occasionally used oxycodone 10mg  but rarely. In the ED he reports no pain or discomfort. Given absence of pain was discharged with plan to use oxycodone 10mg  q4hr PRN without any long acting agent.     Hyponatremia  Multifactorial hyponatremia likely due to chronic kidney disease, dehydration and SIADH. Na was improved to 135 on the date of discharge.

## 2021-02-20 NOTE — Unmapped (Signed)
Physician Discharge Summary [facility]  [facility] EMERGENCY DEPARTMENT  [address]  [place] [address]  Dept: [phone]  Loc: [phone]     Identifying Information:   Walter Armstrong  Feb 28, 1956  [id number]    Primary Care Physician: Wyatt Mage, MD     Referring Physician: Norm Parcel     Code Status: DNR and DNI    Admit Date: 02/19/2021    Discharge Date: 02/20/2021     Discharge To: Home Hospice    Discharge Service: [facility] - Oncology Floor Team (MEDO)     Discharge Attending Physician: Norm Parcel, MD    Discharge Diagnoses:  Principal Problem:    Somnolence  Active Problems:    Oropharynx cancer (CMS-HCC)    Chronic kidney disease, stage III (moderate) (CMS-HCC)    Depression    Malnutrition (CMS-HCC)    Chronic pain    Hypothyroidism (RAF-HCC)    Hypoxia    Tracheostomy dependence (CMS-HCC)    PEG (percutaneous endoscopic gastrostomy) status (CMS-HCC)    SCC (squamous cell carcinoma), scalp/neck  Resolved Problems:    * No resolved hospital problems. *      Outpatient Provider Follow Up Issues:   Follow-up Plan after discharge:  1. Issues related to hospitalization: Acute Hypoxic Respiratory failure, Aspiration pneumonia  2. Patient has no future oncology plans moving forward    Patient's primary oncologist and/or nurse navigator:    Warm handoff via Epic message or direct conversation?: Yes.    Hospital Course:   Walter Armstrong is a 65 y.o. 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    Acute Hypoxic Respiratory Failure  Aspiration Pneumonia  Patient developed acute hypoxic respiratory failure, sating 85% on room air, and requiring 8L 35% FiO2 via trach. This occurred in the setting of likely aspiration event with tube feeds draining from trach in the ED. Chest xray showed bibasilar opacities and left mid lung opacities consistent with aspiration pneumonia. Patient was clear of his goal to discharge as quickly as possible with home hospice. He was transitioned from Unasyn to Augmentin to complete a 5 day course, had home oxygen coordinated and was discharged with home hospice. To reduce the risk of further aspiration events his home tube feeds were transitioned to cyclic feeds.    Somnolence  Etiology of his somnolence remains unclear. Initially suspected excess opioid use but patient reports no increased use of opioids leading up to the event. It is possible patient was somnolent due to hypoxia and hypercarbia. Mental status rapidly improved in the ED and patient was at his baseline on the date of discharge.    Oropharyngeal SCC  New primary neck SCC s/p tracheostomy  Follows with Dr. Janann August. Had a history of oropharynx SCC treated with chemo and radiation therapy in 2014 and now with a new primary SCC. S/p tracheostomy on 3/4 due to respiratory distress and stridor from pharyngeal wall edema. Per ENT, he is not a surgical candidate due to gross tumor extension into prevertebral fascia and bilateral carotid sheaths. He was started on pembrolizumab/paclitaxel/carboplatin and received C2D1(12/03/20) but unfortunately was admitted to hospital soon after for stercoral colitis and GNR bacteremia. Patient has been having ongoing goals of care discussions with his primary oncologist and had been in the process  of establishing with home hospice. He was clear of his goals during this admission. He wanted to return home with home hospice. He was established with home hospice prior to discharge.     Cancer related pain  Patient has been off of his home Xtampza for the past few weeks. At home he has occasionally used oxycodone 10mg  but rarely. In the ED he reports no pain or discomfort. Given absence of pain was discharged with plan to use oxycodone 10mg  q4hr PRN without any long acting agent.     Hyponatremia  Multifactorial hyponatremia likely due to chronic kidney disease, dehydration and SIADH. Na was improved to 135 on the date of discharge.      Procedures:  None  No admission procedures for hospital encounter.  ______________________________________________________________________  Discharge Medications:     Your Medication List      STOP taking these medications    XTAMPZA ER 9 mg Cspt  Generic drug: oxyCODONE myristate        START taking these medications    amoxicillin-clavulanate 400-57 mg/5 mL suspension  Commonly known as: AUGMENTIN  10 mL (800 mg total) by G-tube route Two (2) times a day. Give with food or tube feeds        CHANGE how you take these medications    oxyCODONE 10 mg immediate release tablet  Commonly known as: ROXICODONE  Take 1 tab (10 mg) under tongue or through G tube every 4 hours as needed for pain - maximum of 5 tablets per day.  What changed: additional instructions     senna 8.6 mg tablet  Commonly known as: SENOKOT  2 tablets by G-tube route Two (2) times a day. Hold for diarrhea  What changed: Another medication with the same name was removed. Continue taking this medication, and follow the directions you see here.        CONTINUE taking these medications    diclofenac sodium 1 % gel  Commonly known as: VOLTAREN     levothyroxine 150 MCG tablet  Commonly known as: SYNTHROID  1 tablet (150 mcg total) by G-tube route in the morning.     mirtazapine 15 MG tablet  Commonly known as: REMERON  1 tablet (15 mg total) by G-tube route nightly.     naloxone 4 mg/actuation nasal spray  Commonly known as: NARCAN  One spray in either nostril once for known/suspected opioid overdose. May repeat every 2-3 minutes in alternating nostril til EMS arrives     ondansetron 8 MG disintegrating tablet  Commonly known as: ZOFRAN-ODT  Take 1 tablet (8 mg total) by mouth every eight (8) hours as needed for nausea. place tablet on tongue and allow to dissolve. Swallow with saliva (no need to administer with liquids).     polyethylene glycol 17 gram/dose powder  Commonly known as: MIRALAX  17 g by G-tube route two (2) times a day as needed (constipation).     prochlorperazine 10 MG tablet  Commonly known as: COMPAZINE  1 tablet (10 mg total) by G-tube route every six (6) hours as needed for nausea.     sertraline 50 MG tablet  Commonly known as: ZOLOFT  1 tablet (50 mg total) by G-tube route in the morning.            Allergies:  Patient has no known allergies.  ______________________________________________________________________  Pending Test Results (if blank, then none):  Pending Labs     Order Current Status    Blood Culture, Adult  In process    Blood Culture, Adult In process    Lower Respiratory Culture Preliminary result          Most Recent Labs:  All lab results last 24 hours -   Recent Results (from the past 24 hour(s))   Lower Respiratory Culture    Collection Time: 02/19/21  4:15 PM    Specimen: Tracheal aspirate; Aspirate, Tracheal   Result Value Ref Range    Lower Respiratory Culture TOO YOUNG TO READ     Gram Stain <10  Epithelial cells/LPF     Gram Stain >25 PMNS/LPF     Gram Stain 3+ Mixed Gram Positive Organisms     Gram Stain Acceptable for culture    Basic metabolic panel    Collection Time: 02/20/21  5:06 AM   Result Value Ref Range    Sodium 135 135 - 145 mmol/L    Potassium 5.0 (H) 3.4 - 4.8 mmol/L    Chloride 99 98 - 107 mmol/L    CO2 34.0 (H) 20.0 - 31.0 mmol/L    Anion Gap 2 (L) 5 - 14 mmol/L    BUN 33 (H) 9 - 23 mg/dL    Creatinine 1.61 (H) 0.60 - 1.10 mg/dL    BUN/Creatinine Ratio 18     eGFR CKD-EPI (2021) Male 42 (L) >=60 mL/min/1.71m2    Glucose 74 70 - 179 mg/dL    Calcium 9.3 8.7 - 09.6 mg/dL   Magnesium Level    Collection Time: 02/20/21  5:06 AM   Result Value Ref Range    Magnesium 1.9 1.6 - 2.6 mg/dL   Phosphorus Level    Collection Time: 02/20/21  5:06 AM   Result Value Ref Range    Phosphorus 4.5 2.4 - 5.1 mg/dL   CBC w/ Differential Collection Time: 02/20/21  5:06 AM   Result Value Ref Range    WBC 8.1 3.6 - 11.2 10*9/L    RBC 3.49 (L) 4.26 - 5.60 10*12/L    HGB 9.9 (L) 12.9 - 16.5 g/dL    HCT 04.5 (L) 40.9 - 48.0 %    MCV 86.3 77.6 - 95.7 fL    MCH 28.2 25.9 - 32.4 pg    MCHC 32.7 32.0 - 36.0 g/dL    RDW 81.1 91.4 - 78.2 %    MPV 7.8 6.8 - 10.7 fL    Platelet 298 150 - 450 10*9/L    Neutrophils % 69.2 %    Lymphocytes % 18.3 %    Monocytes % 9.2 %    Eosinophils % 2.0 %    Basophils % 1.3 %    Absolute Neutrophils 5.6 1.8 - 7.8 10*9/L    Absolute Lymphocytes 1.5 1.1 - 3.6 10*9/L    Absolute Monocytes 0.7 0.3 - 0.8 10*9/L    Absolute Eosinophils 0.2 0.0 - 0.5 10*9/L    Absolute Basophils 0.1 0.0 - 0.1 10*9/L   ECG 12 Lead    Collection Time: 02/20/21  5:15 AM   Result Value Ref Range    EKG Systolic BP  mmHg    EKG Diastolic BP  mmHg    EKG Ventricular Rate 70 BPM    EKG Atrial Rate 70 BPM    EKG P-R Interval 176 ms    EKG QRS Duration 58 ms    EKG Q-T Interval 398 ms    EKG QTC Calculation 429 ms    EKG Calculated P Axis 78 degrees    EKG Calculated R Axis  87 degrees    EKG Calculated T Axis 85 degrees    QTC Fredericia 419 ms       Relevant Studies/Radiology (if blank, then none):  ECG 12 Lead    Result Date: 02/20/2021  NORMAL SINUS RHYTHM SEPTAL INFARCT  , AGE UNDETERMINED ABNORMAL ECG WHEN COMPARED WITH ECG OF 04-Oct-2020 20:44, NO SIGNIFICANT CHANGE WAS FOUND Confirmed by Eldred Manges ([id number]) on 02/20/2021 9:24:29 AM    XR Chest Portable    Result Date: 02/19/2021  EXAM: XR CHEST PORTABLE DATE: 02/19/2021 5:15 PM ACCESSION: [id number] UN DICTATED: 02/19/2021 5:19 PM INTERPRETATION LOCATION: [facility] CLINICAL INDICATION: 65 years old Male with DYSPNEA  COMPARISON: CT chest 12/25/2020 TECHNIQUE: Portable Chest Radiograph. FINDINGS: Right chest wall Port-A-Cath with tip overlying the distal SVC. Partially imaged tracheostomy cannula with tip overlying the tracheal air column. Bilateral medial upper lobe radiation fibrosis as seen on prior examinations. Patchy bilateral and nodular airspace opacities. Left midlung hazy opacities. No pleural effusion or pneumothorax. Stable cardiomediastinal silhouette.     Bibasilar left greater than right streaky and nodular airspace opacities as well as left midlung opacities concerning for aspiration/pneumonia    ______________________________________________________________________  Discharge Instructions:     Activity Instructions     Activity as tolerated                Other Instructions     Call MD for:  difficulty breathing, headache or visual disturbances      Call MD for:  persistent nausea or vomiting      Call MD for:  severe uncontrolled pain      Call MD for:  temperature >38.5 Celsius      Discharge instructions      It was a pleasure taking care of you!     You will be transitioning to hospice at home, which is basically a team of doctors and nurses who are available to you to help maximize your quality of life. This looks different for every patient but typically includes management of your symptoms including: nausea, vomiting, constipation, pain, anxiety, shortness of breath, fatigue, poor appetite, or anything else that you feel is impacting your quality of life. Each hospice agency is different, but typically agencies have nurses visit your house several times a week and have a phone number you can call should you or your caretakers have any questions between visits.     During this COVID-19 outbreak, please avoid and or minimize close contact with members outside of your immediate family. If you must be in public wear a mask and remain 6 feet from others.  Avoid large crowds or situations with close contact of multiple people. REMEMBER to wash your hands frequently and increase this frequency when in public.    You will be sent home with a few medications to help bridge the time between leaving the hospital and when you meet your hospice agency. These medications include:   Augmentin: This antibiotic is to treat your pneumonia.  Oxycodone: Please take this medication as needed every 4 hours for pain.     Your hospice agency in many ways will now function as your primary care provider. Your nurse will be able to give medications and consult with his/her supervising provider should more meds/resources be needed. The hospice provider is either your primary care provider/oncologist or a provider who works for the agency. ??They will be your primary point of contact for all medical care moving forward.     Of course, your cancer  team at [facility] is still available as needed for any questions that may arise. Our contact information is below:     For questions/urgent concerns Monday through Friday 8AM-5PM, please first contact your hospice team. If you need to speak with someone at [facility], please contact your nurse navigator:      For appointments Monday through Friday 8 AM- 5 PM   please call [phone] or Toll free [phone].     On Nights, Weekends and Holidays   Call [phone] and ask for the oncologist on call.     [facility]   [address]   [place], [place] [address]   [email or web address]                ______________________________________________________________________  Discharge Day Services:  BP 121/82  - Pulse 78  - Temp 37 ??C  - Resp 18  - SpO2 95%   Pt seen on the day of discharge and determined appropriate for discharge.    Condition at Discharge: fair    Length of Discharge: I spent less than 30 mins in the discharge of this patient.

## 2021-02-21 NOTE — Unmapped (Signed)
Lower Respiratory Culture  Order: [id number]  Status: Preliminary result ??    OROPHARYNGEAL FLORA ISOLATED       Specimen Source: Tracheal aspirate

## 2021-02-21 NOTE — Unmapped (Signed)
Blood Culture, Adult  Order: [id number] and Order: [id number]  Status: Preliminary result ??    No Growth at 24 hours

## 2021-02-22 NOTE — Unmapped (Signed)
Blood Culture, Adult  Order: [id number] and Order: [id number]  Status: Preliminary result ??  ??  No Growth at 48 hours

## 2021-02-22 NOTE — Unmapped (Signed)
Lower Respiratory Culture  Order: [id number]  Status: Preliminary result ??    OROPHARYNGEAL FLORA ISOLATED       Specimen Source: Tracheal aspirate

## 2021-02-22 NOTE — Unmapped (Signed)
Lower Respiratory Culture  Order: [id number]  Status: Preliminary result ??  ??  OROPHARYNGEAL FLORA ISOLATED 02/22/21  ??  ??  Specimen Source: Tracheal aspirate

## 2021-02-23 MED ORDER — BUPROPION HCL SR 150 MG TABLET,12 HR SUSTAINED-RELEASE
ORAL | 3 refills | 0 days
Start: 2021-02-23 — End: ?

## 2021-02-24 ENCOUNTER — Emergency Department: Admit: 2021-02-24 | Discharge: 2021-02-25 | Disposition: A | Discharge status: Home / Self Care | Payer: MEDICARE

## 2021-02-24 ENCOUNTER — Ambulatory Visit: Admit: 2021-02-24 | Discharge: 2021-02-25 | Disposition: A | Discharge status: Home / Self Care | Payer: MEDICARE

## 2021-02-24 LAB — URINALYSIS WITH CULTURE REFLEX
BACTERIA: NONE SEEN /HPF
BILIRUBIN UA: NEGATIVE
GLUCOSE UA: NEGATIVE
KETONES UA: NEGATIVE
LEUKOCYTE ESTERASE UA: NEGATIVE
NITRITE UA: NEGATIVE
PH UA: 7.5 (ref 5.0–9.0)
PROTEIN UA: 200 — AB
RBC UA: 1 /HPF (ref <=3)
SPECIFIC GRAVITY UA: 1.007 (ref 1.003–1.030)
SQUAMOUS EPITHELIAL: <1 /HPF (ref 0–5)
UROBILINOGEN UA: <2
WBC UA: <1 /HPF (ref <=2)

## 2021-02-24 LAB — HIGH SENSITIVITY TROPONIN I - 2 HOUR SERIAL
HIGH SENSITIVITY TROPONIN - DELTA (0-2H): 5 ng/L (ref <=7)
HIGH-SENSITIVITY TROPONIN I - 2 HOUR: 14 ng/L (ref <=53)

## 2021-02-24 LAB — CBC W/ AUTO DIFF
BASOPHILS ABSOLUTE COUNT: 0.1 10*9/L (ref 0.0–0.1)
BASOPHILS RELATIVE PERCENT: 0.5 %
EOSINOPHILS ABSOLUTE COUNT: 0.1 10*9/L (ref 0.0–0.5)
EOSINOPHILS RELATIVE PERCENT: 0.3 %
HEMATOCRIT: 30.5 % — ABNORMAL LOW (ref 39.0–48.0)
HEMOGLOBIN: 9.8 g/dL — ABNORMAL LOW (ref 12.9–16.5)
LYMPHOCYTES ABSOLUTE COUNT: 0.6 10*9/L — ABNORMAL LOW (ref 1.1–3.6)
LYMPHOCYTES RELATIVE PERCENT: 3.1 %
MEAN CORPUSCULAR HEMOGLOBIN CONC: 32.2 g/dL (ref 32.0–36.0)
MEAN CORPUSCULAR HEMOGLOBIN: 28 pg (ref 25.9–32.4)
MEAN CORPUSCULAR VOLUME: 86.9 fL (ref 77.6–95.7)
MEAN PLATELET VOLUME: 7.7 fL (ref 6.8–10.7)
MONOCYTES ABSOLUTE COUNT: 0.8 10*9/L (ref 0.3–0.8)
MONOCYTES RELATIVE PERCENT: 4.4 %
NEUTROPHILS ABSOLUTE COUNT: 17.1 10*9/L — ABNORMAL HIGH (ref 1.8–7.8)
NEUTROPHILS RELATIVE PERCENT: 91.7 %
PLATELET COUNT: 288 10*9/L (ref 150–450)
RED BLOOD CELL COUNT: 3.51 10*12/L — ABNORMAL LOW (ref 4.26–5.60)
RED CELL DISTRIBUTION WIDTH: 15 % (ref 12.2–15.2)
WBC ADJUSTED: 18.7 10*9/L — ABNORMAL HIGH (ref 3.6–11.2)

## 2021-02-24 LAB — COMPREHENSIVE METABOLIC PANEL
ALBUMIN: 2.8 g/dL — ABNORMAL LOW (ref 3.4–5.0)
ALKALINE PHOSPHATASE: 101 U/L (ref 46–116)
ALT (SGPT): 15 U/L (ref 10–49)
ANION GAP: 6 mmol/L (ref 5–14)
AST (SGOT): 20 U/L (ref <=34)
BILIRUBIN TOTAL: 0.3 mg/dL (ref 0.3–1.2)
BLOOD UREA NITROGEN: 39 mg/dL — ABNORMAL HIGH (ref 9–23)
BUN / CREAT RATIO: 17
CALCIUM: 8.9 mg/dL (ref 8.7–10.4)
CHLORIDE: 94 mmol/L — ABNORMAL LOW (ref 98–107)
CO2: 33 mmol/L — ABNORMAL HIGH (ref 20.0–31.0)
CREATININE: 2.36 mg/dL — ABNORMAL HIGH
EGFR CKD-EPI (2021) MALE: 30 mL/min/{1.73_m2} — ABNORMAL LOW (ref >=60)
GLUCOSE RANDOM: 118 mg/dL (ref 70–179)
POTASSIUM: 4.4 mmol/L (ref 3.4–4.8)
PROTEIN TOTAL: 8.2 g/dL (ref 5.7–8.2)
SODIUM: 133 mmol/L — ABNORMAL LOW (ref 135–145)

## 2021-02-24 LAB — BLOOD GAS, VENOUS
BASE EXCESS VENOUS: 9.7 — ABNORMAL HIGH (ref -2.0–2.0)
HCO3 VENOUS: 36 mmol/L — ABNORMAL HIGH (ref 22–27)
O2 SATURATION VENOUS: 71.3 % (ref 40.0–85.0)
PCO2 VENOUS: 70 mmHg (ref 40–60)
PH VENOUS: 7.33 (ref 7.32–7.43)
PO2 VENOUS: 44 mmHg (ref 30–55)

## 2021-02-24 LAB — LACTATE, VENOUS, WHOLE BLOOD: LACTATE BLOOD VENOUS: 0.9 mmol/L (ref 0.5–1.8)

## 2021-02-24 LAB — MAGNESIUM: MAGNESIUM: 1.9 mg/dL (ref 1.6–2.6)

## 2021-02-24 LAB — HIGH SENSITIVITY TROPONIN I - SERIAL: HIGH SENSITIVITY TROPONIN I: 9 ng/L (ref <=53)

## 2021-02-24 MED ADMIN — lactated ringers bolus 500 mL: 500 mL | INTRAVENOUS | @ 22:00:00 | Stop: 2021-02-24

## 2021-02-24 NOTE — Unmapped (Signed)
Patient BIB [facility] with report of syncope at [place] today. Was altered on arrival for EMS. Hx of throat cancer with trach. NRB applied to trach due to hypoxia on arrival. Patient arrives alert. Does not remember incident.

## 2021-02-24 NOTE — Unmapped (Signed)
[facility] Emergency Department Provider Note      ED Course, Assessment and Plan     Initial Clinical Impression:    February 24, 2021 3:07 PM   Walter Armstrong is a 65 y.o. male hx of neck cancer, CKD, G-tube was at church today when had a syncopal episode and hit his head.  AMS per EMS on scene as well as hypoxia requiring supplemental O2 over trach. On exam, Vital signs stable.  Overall chronically ill-appearing but non-toxic and in no acute distress.  Normal cardiac exam. Lung sounds notable for bronchial breath sounds in all fields.  Normal abdominal exam.  No focal neuro deficits. GCS 14 w/ confusion to time. Unknown if baseline for pt. Small superficial abrasion noted to pt's left supraorbital region.    BP 158/99  - Pulse 79  - Temp 36.6 ??C (97.9 ??F) (Oral)  - Resp 8  - SpO2 97%     Diagnosis is undifferentiated but includes metabolic disturbance, hypoxic respiratory failure, ACS, arrhythmia, intracranial process, infection.    Will obtain CBC, CMP, EKG, troponins, UA w/ culture, CT head and neck, CXR. Will treat patient with intervention as needed and re-evaluate. Anticipate admission at this time.     ED Course:    @1540hrs : New AKI creatinine 2.36 (up from 1.8), leukocytosis 18.7.  Chest x-ray negative for acute cardiopulmonary process.  Will order lactate and blood cultures.    @1700hrs : Lactate within normal limits    @1718hrs : Resting comfortably.  Currently on 8 L via trach collar.  Still pending CT head and cervical spine however due to patients hypoxia, new AKI, and high risk will page [facility] for admission.    @1730hrs : Patient PCO2 70.  Spoke with RT who will come remove patient speaking valve to help him breathe a little bit better.    @1836hrs : No acute fracture or traumatic listhesis of the cervical spine and no acute intracranial abnormality    @1839hrs : Paged [facility] for admission        _____________________________________________________________________    The case was discussed with attending physician who is in agreement with the above assessment and plan    Dictation software was used while making this note. Please excuse any errors made with dictation software.    Additional Medical Decision Making     I have reviewed the vital signs and the nursing notes. Labs and radiology results that were available during my care of the patient were independently reviewed by me and considered in my medical decision making.     I independently visualized the EKG tracing if performed  I independently visualized the radiology images if performed  I reviewed the patient's prior medical records if available.  Additional history obtained from family if available    History     CHIEF COMPLAINT:   Chief Complaint   Patient presents with   ??? Syncope       HPI: Walter Armstrong is a 65 y.o. male hx of neck cancer, CKD, G-tube was at church today when had a syncopal episode and hit his head. Per EMS pt was hypoxic on scene and required NRB at 10L which improved his SpO2.    Recently placed on O2 recently for acute hypoxic respiratory failure secondary to aspiration PNA. Per Daughter Walter Armstrong [phone]) pt has not been on his O2 since being discharged from the hospital 02/20/2021     Pt denies any headache, dizziness, visual disturbance, NV, chest pain, SOB, abdominal pain, weakness,  or numbness or tingling. Pt has no complaints of pain or discomfort at this time.     PAST MEDICAL HISTORY/PAST SURGICAL HISTORY:   Past Medical History:   Diagnosis Date   ??? Arthritis     bil  knees   ??? Cancer (CMS-HCC)     SCC   ??? CKD (chronic kidney disease), stage III (CMS-HCC)    ??? Depressive disorder    ??? Hypothyroidism 09/11/2015   ??? Insomnia 07/24/2014   ??? Mass of throat 2015    treated with chemo and radiation    ??? Psychiatric Medication Trials     mirtazapine 15mg  x 2 months   ??? Substance abuse (CMS-HCC)     alcohol use disorder during college   ??? Substance Use Treatment History     h/o attending AA during college years       Past Surgical History:   Procedure Laterality Date   ??? CHEMOTHERAPY      NON-BREAST RELATED   ??? KNEE ARTHROSCOPY Bilateral 1976   ??? KNEE SURGERY Bilateral    ??? PR BRONCHOSCOPY,DIAGNOSTIC N/A 12/27/2012    Procedure: BRONCHOSCOPY, RIGID OR FLEXIBLE, W/WO FLUOROSCOPIC GUIDANCE; DIAGNOSTIC, WITH CELL WASHING, WHEN PERFORMED;  Surgeon: Bethel Born, MD;  Location: MAIN OR [facility];  Service: ENT   ??? PR CLOSURE OF GASTROSTOMY,SURGICAL N/A 04/17/2015    Procedure: CLOSURE OF GASTROSTOMY, SURGICAL;  Surgeon: Rema Fendt, MD;  Location: MAIN OR [facility];  Service: Surgical Oncology   ??? PR COLONOSCOPY FLX DX W/COLLJ SPEC WHEN PFRMD N/A 03/27/2014    Procedure: COLONOSCOPY, FLEXIBLE, PROXIMAL TO SPLENIC FLEXURE; DIAGNOSTIC, W/WO COLLECTION SPECIMEN BY BRUSH OR WASH;  Surgeon: Zetta Bills, MD;  Location: GI PROCEDURES [facility];  Service: Gastroenterology   ??? PR COLONOSCOPY FLX DX W/COLLJ SPEC WHEN PFRMD N/A 06/13/2016    Procedure: COLONOSCOPY, FLEXIBLE, PROXIMAL TO SPLENIC FLEXURE; DIAGNOSTIC, W/WO COLLECTION SPECIMEN BY BRUSH OR WASH;  Surgeon: Liane Comber, MD;  Location: [facility];  Service: Gastroenterology   ??? PR COLONOSCOPY FLX DX W/COLLJ SPEC WHEN PFRMD N/A 08/11/2018    Procedure: COLONOSCOPY, FLEXIBLE, PROXIMAL TO SPLENIC FLEXURE; DIAGNOSTIC, W/WO COLLECTION SPECIMEN BY BRUSH OR WASH;  Surgeon: Chriss Driver, MD;  [facility];  Service: Gastroenterology   ??? PR CPTR-ASST SURGICAL NAVIGATION IMAGE-LESS Right 12/25/2015    Procedure: COMPUTER-ASSISTED SURGICAL NAVIGATIONAL PROCEDURE FOR MUSCULOSKELETAL PROCEDURES; IMAGE-LESS;  Surgeon: Malka So, MD;  Location: [facility] OR [facility];  Service: Orthopedics   ??? PR CPTR-ASST SURGICAL NAVIGATION IMAGE-LESS Left 05/26/2017    Procedure: COMPUTER-ASSISTED SURGICAL NAVIGATIONAL PROCEDURE FOR MUSCULOSKELETAL PROCEDURES; IMAGE-LESS;  Surgeon: Malka So, MD;  Location: [facility] OR [facility];  Service: Ortho Joint Replacement   ??? PR EGD FLEXIBLE FOREIGN BODY REMOVAL N/A 09/10/2015    Procedure: UGI ENDOSCOPY; W/REMOVAL FOREIGN BODY;  Surgeon: Lanier Ensign, MD;  Location: MAIN OR [facility];  Service: Gastroenterology   ??? PR ESOPHAGOSCOPY,DIAGNOSTIC N/A 12/27/2012    Procedure: ESOPHAGOSCOPY, RIGID OR FLEXIBLE; DIAGNOSTIC, W/WO COLLECTION OF SPECIMEN(S) BY BRUSHING OR WASHING;  Surgeon: Bethel Born, MD;  Location: MAIN OR [place];  Service: ENT   ??? PR ESOPHAGOSCOPY,DIAGNOSTIC N/A 10/03/2020    Procedure: ESOPHAGOSCOPY, RIGID OR FLEXIBLE; DIAGNOSTIC, W/WO COLLECTION OF SPECIMEN(S) BY BRUSHING OR WASHING;  Surgeon: Karren Burly, MD;  Location: MAIN OR [place];  Service: ENT   ??? PR EXPLORATION N/FLWD SURG NECK ARTERY Bilateral 10/29/2020    Procedure: EXPLORATION NOT FOLLOWED BY SURGICAL REPAIR, ARTERY;  NECK (EG, CAROTID, SUBCLAVIAN);  Surgeon: Karren Burly, MD;  Location: MAIN OR [facility];  Service: ENT   ??? PR LAP,GASTROSTOMY,W/O TUBE CONSTR N/A 10/05/2020    Procedure: LAPAROSCOPY, SURGICAL; GASTOSTOMY W/O CONSTRUCTION OF GASTRIC TUBE (EG, STAMM PROCEDURE)(SEPARATE PROCED);  Surgeon: Camillo Flaming, MD;  Location: MAIN OR [facility];  Service: Surgical Oncology   ??? PR LARYNGOSCOPY,DIRECT,DIAGNOSTIC N/A 12/27/2012    Procedure: LARYNGOSCOPY DIRECT, WITH OR WITHOUT TRACHEOSCOPY; DIAGNOSTIC, EXCEPT NEWBORN;  Surgeon: Bethel Born, MD;  Location: MAIN OR [place];  Service: ENT   ??? PR LARYNGOSCOPY,DIRECT,DX,OP MICROSCOP N/A 10/03/2020    Procedure: LARYNGOSCOPY DIRECT WITH OR WITHOUT TRACHEOSCPY; DIAGNOSTIC, WITH OPERATING MICROSCOPE OR TELESCOPE;  Surgeon: Karren Burly, MD;  Location: MAIN OR [facility]  Service: ENT   ??? PR REPAIR ING HERNIA,5+Y/O,REDUCIBL Left 04/04/2018    Procedure: REPAIR INITIAL INGUINAL HERNIA, AGE 32 YEARS OR OLDER; REDUCIBLE;  Surgeon: Katherina Mires, MD;  Location: MAIN OR [facility];  Service: Trauma   ??? PR TOTAL KNEE ARTHROPLASTY Right 12/25/2015    Procedure: ARTHROPLASTY, KNEE, CONDYLE & PLATEAU; MEDIAL & LAT COMPARTMENT W/WO PATELLA RESURFACE (TOTAL KNEE ARTHROP);  Surgeon: Malka So, MD;  Location: [facility] OR [facility];  Service: Orthopedics   ??? PR TOTAL KNEE ARTHROPLASTY Left 05/26/2017    Procedure: ARTHROPLASTY, KNEE, CONDYLE & PLATEAU; MEDIAL & LAT COMPARTMENT W/WO PATELLA RESURFACE (TOTAL KNEE ARTHROP);  Surgeon: Malka So, MD;  Location: [facility] OR [facility];  Service: Ortho Joint Replacement   ??? PR TRACHEOSTOMY, PLANNED N/A 11/02/2020    Procedure: AWAKE TRACHEOSTOMY;  Surgeon: Karren Burly, MD;  Location: MAIN OR [place];  Service: ENT   ??? PR UP GI ENDOSCOPY,DILATN W GUIDE N/A 09/27/2015    Procedure: UGI ENDOSCOPY; WITH INSERTION OF GUIDE WIRE FOLLOWED BY DILATION OF ESOPHAGUS OVER GUIDE WIRE;  Surgeon: Lanier Ensign, MD;  [facility];  Service: Gastroenterology   ??? PR UP GI ENDOSCOPY,DILATN W GUIDE N/A 07/10/2016    Procedure: UGI ENDOSCOPY; WITH INSERTION OF GUIDE WIRE FOLLOWED BY DILATION OF ESOPHAGUS OVER GUIDE WIRE;  Surgeon: Liane Comber, MD;  [facility];  Service: Gastroenterology   ??? PR UP GI ENDOSCOPY,DILATN W GUIDE N/A 07/31/2016    Procedure: UGI ENDOSCOPY; WITH INSERTION OF GUIDE WIRE FOLLOWED BY DILATION OF ESOPHAGUS OVER GUIDE WIRE;  Surgeon: Liane Comber, MD;  [facility];  Service: Gastroenterology   ??? PR UPPER GI ENDOSCOPY,BIOPSY N/A 09/27/2015    Procedure: UGI ENDOSCOPY; WITH BIOPSY, SINGLE OR MULTIPLE;  Surgeon: Lanier Ensign, MD;  [facility];  Service: Gastroenterology   ??? PR UPPER GI ENDOSCOPY,BIOPSY N/A 06/13/2016    Procedure: UGI ENDOSCOPY; WITH BIOPSY, SINGLE OR MULTIPLE;  Surgeon: Liane Comber, MD;  Location: [facility];  Service: Gastroenterology   ??? PR UPPER GI ENDOSCOPY,DIAGNOSIS N/A 08/04/2013    Procedure: UGI ENDO, INCLUDE ESOPHAGUS, STOMACH, & DUODENUM &/OR JEJUNUM; DX W/WO COLLECTION SPECIMN, BY BRUSH OR WASH;  Surgeon: Brendia Sacks, MD;  [facility]: [facility];  Service: Gastroenterology   ??? PR UPPER GI ENDOSCOPY,DIAGNOSIS N/A 08/06/2016    Procedure: UGI ENDO, INCLUDE ESOPHAGUS, STOMACH, & DUODENUM &/OR JEJUNUM; DX W/WO COLLECTION SPECIMN, BY BRUSH OR WASH;  Surgeon: Zetta Bills, MD;  [facility];  Service: Gastroenterology   ??? RADIATION      NON-BREAST RELATED       MEDICATIONS:   No current facility-administered  medications for this encounter.    Current Outpatient Medications:   ???  amoxicillin-clavulanate (AUGMENTIN) 400-57 mg/5 mL suspension, Use 10 mL (800 mg total) by G-tube route Two (2) times a day. Give with food or tube feeds, Disp: 100 mL, Rfl: 0  ???  diclofenac sodium (VOLTAREN) 1 % gel, , Disp: , Rfl:   ???  levothyroxine (SYNTHROID) 150 MCG tablet, 1 tablet (150 mcg total) by G-tube route in the morning., Disp: 30 tablet, Rfl: 0  ???  mirtazapine (REMERON) 15 MG tablet, Use 1 tablet (15 mg total) by G-tube route nightly., Disp: 30 tablet, Rfl: 0  ???  naloxone (NARCAN) 4 mg nasal spray, One spray in either nostril once for known/suspected opioid overdose. May repeat every 2-3 minutes in alternating nostril til EMS arrives, Disp: 2 each, Rfl: 0  ???  ondansetron (ZOFRAN-ODT) 8 MG disintegrating tablet, Take 1 tablet (8 mg total) by mouth every eight (8) hours as needed for nausea. place tablet on tongue and allow to dissolve. Swallow with saliva (no need to administer with liquids)., Disp: 30 tablet, Rfl: 1  ???  oxyCODONE (ROXICODONE) 10 mg immediate release tablet, Take 1 tab (10 mg) under tongue or through G tube every 4 hours as needed for pain - maximum of 5 tablets per day., Disp: , Rfl: 0  ???  polyethylene glycol (MIRALAX) 17 gram/dose powder, 17 g by G-tube route two (2) times a day as needed (constipation)., Disp: 850 g, Rfl: 3  ???  prochlorperazine (COMPAZINE) 10 MG tablet, 1 tablet (10 mg total) by G-tube route every six (6) hours as needed for nausea., Disp: 30 tablet, Rfl: 1  ???  senna (SENOKOT) 8.6 mg tablet, 2 tablets by G-tube route Two (2) times a day. Hold for diarrhea, Disp: 120 tablet, Rfl: 2  ???  sertraline (ZOLOFT) 50 MG tablet, 1 tablet (50 mg total) by G-tube route in the morning., Disp: 30 tablet, Rfl: 0    ALLERGIES:   Patient has no known allergies.    SOCIAL HISTORY:   Social History     Tobacco Use   ??? Smoking status: Current Every Day Smoker     Years: 15.00     Types: Cigars     Start date: 04/03/1988   ??? Smokeless tobacco: Never Used   ??? Tobacco comment: 3-4 little cigars/day with intent to quit   Substance Use Topics   ??? Alcohol use: No     Alcohol/week: 0.0 standard drinks     Comment: quit 20 years ago       FAMILY HISTORY:  Family History   Problem Relation Age of Onset   ??? Breast cancer Mother 25   ??? Hypertension Father    ??? Seizures Sister    ??? Tuberculosis Other         stepfather had pulmonary tb and treated when patient lived with him as kid   ??? No Known Problems Brother    ??? No Known Problems Maternal Aunt    ??? No Known Problems Maternal Uncle    ??? No Known Problems Paternal Aunt    ??? No Known Problems Paternal Uncle    ??? No Known Problems Maternal Grandmother    ??? No Known Problems Maternal Grandfather    ??? No Known Problems Paternal Grandmother    ??? No Known Problems Paternal Grandfather    ??? Drug abuse Neg Hx    ??? Depression Neg Hx    ??? Bipolar disorder Neg Hx    ???  Anxiety disorder Neg Hx    ??? Alcohol abuse Neg Hx    ??? Schizophrenia Neg Hx    ??? Physical abuse Neg Hx    ??? Anesthesia problems Neg Hx    ??? Broken bones Neg Hx    ??? Cancer Neg Hx    ??? Clotting disorder Neg Hx    ??? Collagen disease Neg Hx    ??? Diabetes Neg Hx    ??? Dislocations Neg Hx    ??? Fibromyalgia Neg Hx    ??? Gout Neg Hx    ??? Hemophilia Neg Hx    ??? Osteoporosis Neg Hx    ??? Rheumatologic disease Neg Hx    ??? Scoliosis Neg Hx    ??? Severe sprains Neg Hx    ??? Sickle cell anemia Neg Hx    ??? Spinal Compression Fracture Neg Hx           Review of Systems    A 10 point review of systems was performed and is negative other than positive elements noted in HPI   Constitutional: Negative for fever.  Eyes: Negative for visual changes.  ENT: Negative for sore throat.  Cardiovascular: No chest pain.  Respiratory: Negative for shortness of breath.  Gastrointestinal: Negative for abdominal pain, vomiting or diarrhea.  Genitourinary: Negative for dysuria.  Musculoskeletal: Negative for back pain.  Skin: Negative for rash.  Neurological: Negative for headaches, focal weakness or numbness.    Physical Exam     VITAL SIGNS:    BP 158/99  - Pulse 79  - Temp 36.6 ??C (97.9 ??F) (Oral)  - Resp 8  - SpO2 97%     Constitutional: Alert and oriented x 3 negative to time. Chronically ill- appearing but in no acute distress  Eyes: Conjunctivae are normal.  ENT       Head: Normocephalic. Superficial abrasion to left supraorbital region.       Nose: No congestion.       Mouth/Throat: Mucous membranes are moist.       Neck: No stridor.  Cardiovascular: Normal rate, regular rhythm. 2+ radial pulses equal bilaterally. <2 second cap refill.  Respiratory: Normal respiratory effort. Breath sounds are harsh throughout  Gastrointestinal: Soft and nontender.   Genitourinary: No suprapubic tenderness  Musculoskeletal: Normal range of motion in all extremities.   Neurologic: No gross focal neurologic deficits are appreciated.  Skin: Skin is warm, dry.  Psychiatric: Mood and affect are normal. Speech and behavior are normal.         Radiology     XR Chest 1 view Portable   Final Result      No significant interval change.      CT Head Wo Contrast    (Results Pending)   CT Cervical Spine Trauma    (Results Pending)     ECG 12 Lead    Result Date: 02/24/2021  NORMAL SINUS RHYTHM NORMAL ECG WHEN COMPARED WITH ECG OF 20-Feb-2021 05:15, CRITERIA FOR SEPTAL INFARCT  ARE NO LONGER PRESENT    XR Chest 1 view Portable    Result Date: 02/24/2021  EXAM: XR CHEST PORTABLE DATE: 02/24/2021 2:49 PM ACCESSION: [id number] UN DICTATED: 02/24/2021 3:01 PM INTERPRETATION LOCATION: Main [facility] CLINICAL INDICATION: 65 years old Male with ALTERED MENTAL STATUS  COMPARISON: 02/19/2021 TECHNIQUE: Portable Chest Radiograph. FINDINGS: Stable tracheostomy and right chest wall port. Similar bilateral reticulonodular opacities. No new consolidation. Stable biapical pleural thickening. No significant pleural effusion. No pneumothorax. Stable cardiomediastinal silhouette.  No significant interval change.          Pertinent labs & imaging results that were available during my care of the patient were reviewed by me and considered in my medical decision making (see chart for details).    Please note- This chart has been created using [employer]. Chart creation errors have been sought, but may not always be located and such creation errors, especially pronoun confusion, do NOT reflect on the standard of medical care.     Rico Junker, MD  Resident  02/26/21 [phone]

## 2021-02-24 NOTE — Unmapped (Signed)
Bed: 66-D  Expected date:   Expected time:   Means of arrival:   Comments:  ems

## 2021-02-25 ENCOUNTER — Other Ambulatory Visit: Payer: Self-pay

## 2021-02-25 ENCOUNTER — Other Ambulatory Visit: Payer: Medicare (Managed Care) | Admitting: Nurse Practitioner

## 2021-02-25 MED ORDER — BUPROPION HCL SR 150 MG TABLET,12 HR SUSTAINED-RELEASE
ORAL | 3 refills | 0 days
Start: 2021-02-25 — End: 2021-05-26

## 2021-02-25 MED ADMIN — heparin, porcine (PF) 100 unit/mL injection 300 Units: 300 [IU] | INTRAVENOUS | @ 07:00:00 | Stop: 2021-02-25

## 2021-02-25 NOTE — Unmapped (Signed)
I received patient care from Dr. Vonzella Nipple at shift change. I reviewed the patient records and evaluated the patient on my arrival.    Vitals:    02/25/21 0000   BP: 110/75   Pulse: 71   Resp: 11   Temp:    SpO2: 97%       ?? Illness Severity: stable  ?? Patient Summary: Walter Armstrong is a 65 y.o. male who presented to the emergency department today after syncopal episode, fell and struck his head.  ?? Action List: Follow-up CT imaging and admit.    ED Course:   ED Course as of 02/25/21 0217   Sun Feb 24, 2021   1958 Radiology overread showed trace subarachnoid hemorrhage, neurosurgery has been paged.     Ultimately, patient's imaging was remarkable for out subarachnoid hemorrhage and worsening of his cervical spine fracture.  However, patient is on home hospice, and requests to go home, after evaluation by the neurosurgery team, as well as the medicine admitting team, we are going to respect the patient's wishes and discharge him.  I also discussed with him that we could give him a cervical collar to help with his cervical spinal fracture, however he politely declined this as well.  He was discharged.    Disclaimer: This note was created using Data processing manager and may contain occasional errors in spelling, grammar, and erroneous words. Such errors have been sought, but not all may have been identified and corrected.

## 2021-02-25 NOTE — Unmapped (Signed)
Blood Culture, Adult  Order: [id number] and Order: [id number]  Status: final result ??  ??  No Growth at 5 days

## 2021-02-25 NOTE — Unmapped (Signed)
02/24/21 2300   Tracheostomy Shiley 6 Uncuffed   Placement Date/Time: 02/19/21 (c)   Trach Brand: Shiley  Size : 6  Tube Style: Uncuffed   Trach Status Patent;Secured   Site Assessment Clean;Dry   Site Care Cleansed;Dried;Dressing changed/applied   Inner Cannula Care Changed/new   Ties Assessment Clean;Dry   Ties Changed Not needed   Airway Maintenance   Airway Type? Trach   Cuff pressure check cfs   Trach/Stoma care completed by RT yes   $$ Trach tube changed No   Adverse Reactions None      02/24/21 2300   Vitals   Heart Rate 72   Resp 10   SpO2 98 %   BP 122/80   Oxygen Therapy/Pulse Ox   O2 Device Trach mask   O2 Therapy Oxygen humidified   O2 Flow Rate (L/min) 5 L/min   FiO2 (%) 28 %   $$ Pulse Ox Charge Yes   ROX Index   ROX Index Score 35   Respiratory   Respiratory (WDL) X   Respiratory Pattern Regular;Unlabored   Chest Assessment Chest expansion symmetrical   Bilateral Breath Sounds Rhonchi;Diminished   R Breath Sounds All Fields   Respiratory Interventions Airway suction;Airway safety measures   Sputum How Obtained Tracheal   Sputum Description   Sputum Amount Small   Sputum Color White   Sputum Consistency Thick   Airway Suctioning/Secretions   Suction Type Tracheal   Suction Device Catheter   Secretion Amount Small   Secretion Color White   Secretion Consistency Thick   Suction Tolerance Tolerated well   Suctioning Adverse Effects None

## 2021-02-25 NOTE — Unmapped (Addendum)
Walter Armstrong is a 65 y.o. 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     Patient brought in by EMS after syncopal event at church with head injury. Was altered after the event on EMS arrival and was hypoxic requiring supplemental O2 over trach. In the ED was afebrile, intermittently tachy up to 130, HDS, on trach with 5L O2 and 28% FiO2.    Labs showed new leukocytosis at 18.7, stable anemia at 9.8, CMP with mild hyponatremia, hypochloremia, elevated CO2 (at baseline), AKI with creatinine at 2.36 (from recent 1.80), tropoinin WNL, VBG with normal pH 7.33, elevated CO2 at 70, and elevated Bicarb at 36, lactate 0.9, UA without evidence of infection, EKG with NSR, CXR with stable reticulonodular opacities without new consolidation, CT head showed a small high density focus in the right medial frontal lobe (2:24; 4:23), concerning for trace subarachnoid hemorrhage. CT cervical spine showed no acute fracture or traumatic listhesis, similar lytic lesions, remote C7 endplate compression deformity and Multilevel cervical spondylosis with mild C4-C5 anterolisthesis and C5-C6 retrolisthesis.    This patient was recently admitted to [facility] with acute hypoxic respiratory failure and somnolence felt to be due to an aspiration event. At that admission he was discharged with home hospice. His goals are to maximize his time at home. He was unable to state what happened prior to his syncopal event today. He reports no knowledge of any prodromal symptoms. In th ED he was back to his usual baseline, alert and oriented. He expressed a strong desire not to be admitted to the hospital. He understood that avoiding admission and full workup of his syncopal advent placed him at risk of significant illness and death. He understood the imaging findings of reported subarachnoid hemorrhage and reported that he would have no interest in surgery if this was offered to him. He was reported that he understood, was able to state these risks, and expressed that his goal was to leave the emergency room and to return home with home hospice.

## 2021-02-25 NOTE — Unmapped (Signed)
NEUROSURGERY CONSULT NOTE    Requesting Attending Physician:  Blima Singer, MD  Service Requesting Consult:  Emergency Medicine    Assessment and Recommendations  Walter Armstrong is a 65 y.o. 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 Neurologic exam appears to be at baseline. CT head without contrast demonstrates very small frontal hyperdensity c/w tSAH with no mass effect. CT cervical spine demonstrates multilevel degenerative changes with previously seen C7 superior endplate compression deformity that appears worsened compared to prior. Given patient's clinical and neurologic findings, no acute neurosurgical intervention anticipated at this time.     Plan:   **tSAH  - No acute neurosurgical intervention anticipated at this time   - No neurosurgery follow up needed   - Hold off on starting any antiplatelets/anticoagulation for at least 14 days after your head bleed occurred     **C7 Superior endplate fx  - Normally we would recommend continuing cervical collar but will defer to patient whether he wants to continue wearing it in the setting of his discharging to hospice and wishing to pursue comfort based measures.     Patient was discussed with Arletha Grippe, MD and staffed with Dr. Erenest Rasher, MD . Please page the Neurosurgery consult pager at [phone] with questions/concerns.     Problem List  Active Problems:    * No active hospital problems. *  Resolved Problems:    * No resolved hospital problems. *      History of Present Illness  Walter Armstrong is a 65 y.o. male seen in consultation at the request of Blima Singer, MD for evaluation of tSAH. Per report, patient had a syncopal event at church earlier today when he fell and hit his head. He was transferred to [facility] ED for eval and found to have AKI. Clinically, he appears stable and reports no new weakness, sensation loss or AMS. He is able to participate in exam easily and has no apparent focal neurologic deficits. Patient has had complicated medical course with multiple previous admissions most recently to [facility] for hypoxic respiratory failure where he was discharged to home hospice with the wish to remain comfortable. Today, he stated that he does not wish to be admitted and would like to go home regardless of the tSAH.     Review of Systems  A 10-system review of systems was conducted and was negative except as documented above in the HPI.  ______________________________________________________________________    Physical Exam  General: No acute distress; There is no height or weight on file to calculate BMI.    Neurological Exam:  EOspont, Ox3  PERRL, EOMI  FS, TML  FCx4   5/5 x 4  Unable to assess drift due to baseline L shoulder pain   SILT    Cardiovascular: Warm and well perfused with good pulses, no edema  Pulmonary: Unlabored breathing, no pursed lips or wheezing, acyanotic  Abdomen: Soft, nontender, nondistended    Neurological imaging reviewed  CT head/cervical spine without contrast: See review detailed above     The patient's available vitals, intake/output, medications, labs, and relevant neurological imaging were independently reviewed.    ---Historical data---    Allergies  Patient has no known allergies.    Medications  Reviewed in Epic.    Past Medical History  Past Medical History:  Diagnosis Date   ??? Arthritis     bil  knees   ??? Cancer (CMS-HCC)     SCC   ??? CKD (chronic kidney disease), stage III (CMS-HCC)    ??? Depressive disorder    ??? Hypothyroidism 09/11/2015   ??? Insomnia 07/24/2014   ??? Mass of throat 2015    treated with chemo and radiation    ??? Psychiatric Medication Trials     mirtazapine 15mg  x 2 months   ??? Substance abuse (CMS-HCC)     alcohol use disorder during college   ??? Substance Use Treatment History     h/o attending AA during college years       Past Surgical History  Past Surgical History:   Procedure Laterality Date   ??? CHEMOTHERAPY      NON-BREAST RELATED   ??? KNEE ARTHROSCOPY Bilateral 1976   ??? KNEE SURGERY Bilateral    ??? PR BRONCHOSCOPY,DIAGNOSTIC N/A 12/27/2012    Procedure: BRONCHOSCOPY, RIGID OR FLEXIBLE, W/WO FLUOROSCOPIC GUIDANCE; DIAGNOSTIC, WITH CELL WASHING, WHEN PERFORMED;  Surgeon: Bethel Born, MD;  Location: MAIN OR [facility];  Service: ENT   ??? PR CLOSURE OF GASTROSTOMY,SURGICAL N/A 04/17/2015    Procedure: CLOSURE OF GASTROSTOMY, SURGICAL;  Surgeon: Rema Fendt, MD;  Location: MAIN OR [facility];  Service: Surgical Oncology   ??? PR COLONOSCOPY FLX DX W/COLLJ SPEC WHEN PFRMD N/A 03/27/2014    Procedure: COLONOSCOPY, FLEXIBLE, PROXIMAL TO SPLENIC FLEXURE; DIAGNOSTIC, W/WO COLLECTION SPECIMEN BY BRUSH OR WASH;  Surgeon: Zetta Bills, MD;  [facility]: GI PROCEDURES [facility] [facility];  Service: Gastroenterology   ??? PR COLONOSCOPY FLX DX W/COLLJ SPEC WHEN PFRMD N/A 06/13/2016    Procedure: COLONOSCOPY, FLEXIBLE, PROXIMAL TO SPLENIC FLEXURE; DIAGNOSTIC, W/WO COLLECTION SPECIMEN BY BRUSH OR WASH;  Surgeon: Liane Comber, MD;  [facility]: [facility];  Service: Gastroenterology   ??? PR COLONOSCOPY FLX DX W/COLLJ SPEC WHEN PFRMD N/A 08/11/2018    Procedure: COLONOSCOPY, FLEXIBLE, PROXIMAL TO SPLENIC FLEXURE; DIAGNOSTIC, W/WO COLLECTION SPECIMEN BY BRUSH OR WASH;  Surgeon: Chriss Driver, MD;  Location[facility];  Service: Gastroenterology   ??? PR CPTR-ASST SURGICAL NAVIGATION IMAGE-LESS Right 12/25/2015    Procedure: COMPUTER-ASSISTED SURGICAL NAVIGATIONAL PROCEDURE FOR MUSCULOSKELETAL PROCEDURES; IMAGE-LESS;  Surgeon: Malka So, MD;  [facility];  Service: Orthopedics   ??? PR CPTR-ASST SURGICAL NAVIGATION IMAGE-LESS Left 05/26/2017    Procedure: COMPUTER-ASSISTED SURGICAL NAVIGATIONAL PROCEDURE FOR MUSCULOSKELETAL PROCEDURES; IMAGE-LESS;  Surgeon: Malka So, MD; Location: [facility] OR [facility];  Service: Ortho Joint Replacement   ??? PR EGD FLEXIBLE FOREIGN BODY REMOVAL N/A 09/10/2015    Procedure: UGI ENDOSCOPY; W/REMOVAL FOREIGN BODY;  Surgeon: Lanier Ensign, MD;  Location: MAIN OR [facility];  Service: Gastroenterology   ??? PR ESOPHAGOSCOPY,DIAGNOSTIC N/A 12/27/2012    Procedure: ESOPHAGOSCOPY, RIGID OR FLEXIBLE; DIAGNOSTIC, W/WO COLLECTION OF SPECIMEN(S) BY BRUSHING OR WASHING;  Surgeon: Bethel Born, MD;  Location: MAIN OR [place];  Service: ENT   ??? PR ESOPHAGOSCOPY,DIAGNOSTIC N/A 10/03/2020    Procedure: ESOPHAGOSCOPY, RIGID OR FLEXIBLE; DIAGNOSTIC, W/WO COLLECTION OF SPECIMEN(S) BY BRUSHING OR WASHING;  Surgeon: Karren Burly, MD;  Location: MAIN OR [place];  Service: ENT   ??? PR EXPLORATION N/FLWD SURG NECK ARTERY Bilateral 10/29/2020    Procedure: EXPLORATION NOT FOLLOWED BY SURGICAL REPAIR, ARTERY; NECK (EG, CAROTID, SUBCLAVIAN);  Surgeon: Karren Burly, MD;  Location: MAIN OR [facility];  Service: ENT   ??? PR LAP,GASTROSTOMY,W/O TUBE CONSTR N/A 10/05/2020  Procedure: LAPAROSCOPY, SURGICAL; GASTOSTOMY W/O CONSTRUCTION OF GASTRIC TUBE (EG, STAMM PROCEDURE)(SEPARATE PROCED);  Surgeon: Camillo Flaming, MD;  Location: MAIN OR [facility];  Service: Surgical Oncology   ??? PR LARYNGOSCOPY,DIRECT,DIAGNOSTIC N/A 12/27/2012    Procedure: LARYNGOSCOPY DIRECT, WITH OR WITHOUT TRACHEOSCOPY; DIAGNOSTIC, EXCEPT NEWBORN;  Surgeon: Bethel Born, MD;  Location: MAIN OR [place];  Service: ENT   ??? PR LARYNGOSCOPY,DIRECT,DX,OP MICROSCOP N/A 10/03/2020    Procedure: LARYNGOSCOPY DIRECT WITH OR WITHOUT TRACHEOSCPY; DIAGNOSTIC, WITH OPERATING MICROSCOPE OR TELESCOPE;  Surgeon: Karren Burly, MD;  Location: MAIN OR [facility];  Service: ENT   ??? PR REPAIR ING HERNIA,5+Y/O,REDUCIBL Left 04/04/2018    Procedure: REPAIR INITIAL INGUINAL HERNIA, AGE 61 YEARS OR OLDER; REDUCIBLE;  Surgeon: Katherina Mires, MD;  Location: MAIN OR [facility];  Service: Trauma   ??? PR TOTAL KNEE ARTHROPLASTY Right 12/25/2015 Procedure: ARTHROPLASTY, KNEE, CONDYLE & PLATEAU; MEDIAL & LAT COMPARTMENT W/WO PATELLA RESURFACE (TOTAL KNEE ARTHROP);  Surgeon: Malka So, MD;  [facility]: [facility];  Service: Orthopedics   ??? PR TOTAL KNEE ARTHROPLASTY Left 05/26/2017    Procedure: ARTHROPLASTY, KNEE, CONDYLE & PLATEAU; MEDIAL & LAT COMPARTMENT W/WO PATELLA RESURFACE (TOTAL KNEE ARTHROP);  Surgeon: Malka So, MD;  Location: [facility] OR [facility];  Service: Ortho Joint Replacement   ??? PR TRACHEOSTOMY, PLANNED N/A 11/02/2020    Procedure: AWAKE TRACHEOSTOMY;  Surgeon: Karren Burly, MD;  Location: MAIN OR [place];  Service: ENT   ??? PR UP GI ENDOSCOPY,DILATN W GUIDE N/A 09/27/2015    Procedure: UGI ENDOSCOPY; WITH INSERTION OF GUIDE WIRE FOLLOWED BY DILATION OF ESOPHAGUS OVER GUIDE WIRE;  Surgeon: Lanier Ensign, MD;  Location: [facility]URES [facility];  Service: Gastroenterology   ??? PR UP GI ENDOSCOPY,DILATN W GUIDE N/A 07/10/2016    Procedure: UGI ENDOSCOPY; WITH INSERTION OF GUIDE WIRE FOLLOWED BY DILATION OF ESOPHAGUS OVER GUIDE WIRE;  Surgeon: Liane Comber, MD;  Location: GI PROCEDURES [facility];  Service: Gastroenterology   ??? PR UP GI ENDOSCOPY,DILATN W GUIDE N/A 07/31/2016    Procedure: UGI ENDOSCOPY; WITH INSERTION OF GUIDE WIRE FOLLOWED BY DILATION OF ESOPHAGUS OVER GUIDE WIRE;  Surgeon: Liane Comber, MD;  [facility]: [facility];  Service: Gastroenterology   ??? PR UPPER GI ENDOSCOPY,BIOPSY N/A 09/27/2015    Procedure: UGI ENDOSCOPY; WITH BIOPSY, SINGLE OR MULTIPLE;  Surgeon: Lanier Ensign, MD;  [facility]: [facility];  Service: Gastroenterology   ??? PR UPPER GI ENDOSCOPY,BIOPSY N/A 06/13/2016    Procedure: UGI ENDOSCOPY; WITH BIOPSY, SINGLE OR MULTIPLE;  Surgeon: Liane Comber, MD;  Location: [facility]ED[facility];  Service: Gastroenterology   ??? PR UPPER GI ENDOSCOPY,DIAGNOSIS N/A 08/04/2013    Procedure: UGI ENDO, INCLUDE ESOPHAGUS, STOMACH, & DUODENUM &/OR JEJUNUM; DX W/WO COLLECTION SPECIMN, BY BRUSH OR WASH;  Surgeon: Brendia Sacks, MD;  Location: GI PROCEDURES [place] [facility];  Service: Gastroenterology   ??? PR UPPER GI ENDOSCOPY,DIAGNOSIS N/A 08/06/2016    Procedure: UGI ENDO, INCLUDE ESOPHAGUS, STOMACH, & DUODENUM &/OR JEJUNUM; DX W/WO COLLECTION SPECIMN, BY BRUSH OR WASH;  Surgeon: Zetta Bills, MD;  Location: [facility] PROC[facility]URES [facility];  Service: Gastroenterology   ??? RADIATION      NON-BREAST RELATED       Family History  Family History   Problem Relation Age of Onset   ??? Breast cancer Mother 79   ??? Hypertension Father    ??? Seizures Sister    ??? Tuberculosis Other  stepfather had pulmonary tb and treated when patient lived with him as kid   ??? No Known Problems Brother    ??? No Known Problems Maternal Aunt    ??? No Known Problems Maternal Uncle    ??? No Known Problems Paternal Aunt    ??? No Known Problems Paternal Uncle    ??? No Known Problems Maternal Grandmother    ??? No Known Problems Maternal Grandfather    ??? No Known Problems Paternal Grandmother    ??? No Known Problems Paternal Grandfather    ??? Drug abuse Neg Hx    ??? Depression Neg Hx    ??? Bipolar disorder Neg Hx    ??? Anxiety disorder Neg Hx    ??? Alcohol abuse Neg Hx    ??? Schizophrenia Neg Hx    ??? Physical abuse Neg Hx    ??? Anesthesia problems Neg Hx    ??? Broken bones Neg Hx    ??? Cancer Neg Hx    ??? Clotting disorder Neg Hx    ??? Collagen disease Neg Hx    ??? Diabetes Neg Hx    ??? Dislocations Neg Hx    ??? Fibromyalgia Neg Hx    ??? Gout Neg Hx    ??? Hemophilia Neg Hx    ??? Osteoporosis Neg Hx    ??? Rheumatologic disease Neg Hx    ??? Scoliosis Neg Hx    ??? Severe sprains Neg Hx    ??? Sickle cell anemia Neg Hx    ??? Spinal Compression Fracture Neg Hx        Social History  Social History     Tobacco Use   ??? Smoking status: Current Every Day Smoker     Years: 15.00     Types: Cigars     Start date: 04/03/1988   ??? Smokeless tobacco: Never Used   ??? Tobacco comment: 3-4 little cigars/day with intent to quit   Vaping Use   ??? Vaping Use: Never used   Substance Use Topics   ??? Alcohol use: No     Alcohol/week: 0.0 standard drinks     Comment: quit 20 years ago   ??? Drug use: No     Comment: h/o rare cannabis use (monthly) during college; negative for any other drug use

## 2021-02-26 NOTE — Unmapped (Signed)
Blood Culture   Order: [id number] and Order: [id number]  Status: Preliminary result ??      No Growth at 24 hours

## 2021-02-27 NOTE — Unmapped (Signed)
Blood Culture   Order: [id number] and Order: [id number]  Status: Preliminary result ??  ??  ??  No Growth at 48 hours

## 2021-02-28 NOTE — Unmapped (Signed)
Blood Culture   Order: [id number] and Order: [id number]  Status: Preliminary result ??  ??  ??  No Growth at 72 hours

## 2021-03-01 NOTE — Unmapped (Signed)
Blood Culture   Order: [id number] and Order: [id number]  Status: final result ??  ??  ??  No Growth at 5 days

## 2021-03-01 NOTE — Unmapped (Signed)
Blood Culture   Order: [id number] and Order: [id number]  Status: Preliminary result ??  ??  ??  No Growth at 4 days

## 2021-04-04 MED ORDER — DICLOFENAC 1 % TOPICAL GEL
5 refills | 0 days
Start: 2021-04-04 — End: ?

## 2021-04-29 NOTE — Unmapped (Signed)
Specialty Medication(s): Udenyca    [name] has been dis-enrolled from the [facility] specialty pharmacy services due to patient never needed treatment.    Additional information provided to the patient: no    Rollen Sox  [facility] Specialty Pharmacist

## 2021-05-03 DIAGNOSIS — C109 Malignant neoplasm of oropharynx, unspecified: Principal | ICD-10-CM

## 2021-05-03 DIAGNOSIS — N189 Chronic kidney disease, unspecified: Principal | ICD-10-CM

## 2021-05-03 DIAGNOSIS — M1711 Unilateral primary osteoarthritis, right knee: Principal | ICD-10-CM

## 2021-05-03 DIAGNOSIS — D631 Anemia in chronic kidney disease: Principal | ICD-10-CM

## 2021-05-08 ENCOUNTER — Ambulatory Visit: Admit: 2021-05-08 | Payer: MEDICARE

## 2021-05-20 ENCOUNTER — Ambulatory Visit: Admit: 2021-05-20 | Payer: MEDICARE

## 2021-05-20 ENCOUNTER — Ambulatory Visit: Admit: 2021-05-20 | Discharge: 2021-05-31 | Disposition: A | Discharge status: Hospice | Payer: MEDICARE

## 2021-05-29 MED ORDER — HALOPERIDOL LACTATE 2 MG/ML ORAL CONCENTRATE
GASTROSTOMY | 0 refills | 10 days | PRN
Start: 2021-05-29 — End: ?

## 2021-06-04 DIAGNOSIS — F321 Major depressive disorder, single episode, moderate: Principal | ICD-10-CM

## 2021-06-04 DIAGNOSIS — C109 Malignant neoplasm of oropharynx, unspecified: Principal | ICD-10-CM

## 2021-07-02 DEATH — deceased

## 2022-10-17 MED ORDER — DICLOFENAC 1 % TOPICAL GEL
5 refills | 0 days
Start: 2022-10-17 — End: ?
